# Patient Record
Sex: Male | Born: 1955 | Race: White | Hispanic: No | Marital: Married | Smoking: Never smoker
Health system: Southern US, Community
[De-identification: ages and names within clinical notes are randomized; demographics above are authoritative.]

## PROBLEM LIST (undated history)

## (undated) DIAGNOSIS — Z87442 Personal history of urinary calculi: Secondary | ICD-10-CM

## (undated) DIAGNOSIS — C801 Malignant (primary) neoplasm, unspecified: Secondary | ICD-10-CM

## (undated) DIAGNOSIS — M1990 Unspecified osteoarthritis, unspecified site: Secondary | ICD-10-CM

## (undated) DIAGNOSIS — T7840XA Allergy, unspecified, initial encounter: Secondary | ICD-10-CM

## (undated) DIAGNOSIS — J189 Pneumonia, unspecified organism: Secondary | ICD-10-CM

## (undated) HISTORY — DX: Allergy, unspecified, initial encounter: T78.40XA

## (undated) HISTORY — PX: POLYPECTOMY: SHX149

## (undated) HISTORY — PX: OTHER SURGICAL HISTORY: SHX169

## (undated) HISTORY — PX: COLONOSCOPY: SHX174

---

## 2008-07-20 ENCOUNTER — Ambulatory Visit: Payer: Self-pay | Admitting: Gastroenterology

## 2008-07-20 DIAGNOSIS — K625 Hemorrhage of anus and rectum: Secondary | ICD-10-CM

## 2008-07-27 ENCOUNTER — Telehealth: Payer: Self-pay | Admitting: Gastroenterology

## 2008-07-30 ENCOUNTER — Ambulatory Visit: Payer: Self-pay | Admitting: Gastroenterology

## 2008-07-30 ENCOUNTER — Encounter: Payer: Self-pay | Admitting: Gastroenterology

## 2008-08-04 ENCOUNTER — Encounter: Payer: Self-pay | Admitting: Gastroenterology

## 2016-05-21 ENCOUNTER — Encounter: Payer: Self-pay | Admitting: Gastroenterology

## 2017-01-31 ENCOUNTER — Encounter: Payer: Self-pay | Admitting: Gastroenterology

## 2017-03-11 ENCOUNTER — Ambulatory Visit: Payer: Self-pay | Admitting: Gastroenterology

## 2017-03-11 ENCOUNTER — Telehealth: Payer: Self-pay

## 2017-03-11 NOTE — Telephone Encounter (Signed)
Patient cancelled states he is not having any further rectal bleeding, and weightloss was purposeful. He did not want to reschedule at this time and will call back to reschedule.

## 2018-01-06 ENCOUNTER — Ambulatory Visit (INDEPENDENT_AMBULATORY_CARE_PROVIDER_SITE_OTHER): Payer: BLUE CROSS/BLUE SHIELD

## 2018-01-06 ENCOUNTER — Encounter (INDEPENDENT_AMBULATORY_CARE_PROVIDER_SITE_OTHER): Payer: Self-pay | Admitting: Orthopedic Surgery

## 2018-01-06 ENCOUNTER — Ambulatory Visit (INDEPENDENT_AMBULATORY_CARE_PROVIDER_SITE_OTHER): Payer: BLUE CROSS/BLUE SHIELD | Admitting: Orthopedic Surgery

## 2018-01-06 DIAGNOSIS — G8929 Other chronic pain: Secondary | ICD-10-CM | POA: Diagnosis not present

## 2018-01-06 DIAGNOSIS — M25511 Pain in right shoulder: Secondary | ICD-10-CM | POA: Diagnosis not present

## 2018-01-08 ENCOUNTER — Other Ambulatory Visit (INDEPENDENT_AMBULATORY_CARE_PROVIDER_SITE_OTHER): Payer: Self-pay | Admitting: Orthopedic Surgery

## 2018-01-08 DIAGNOSIS — T1590XA Foreign body on external eye, part unspecified, unspecified eye, initial encounter: Secondary | ICD-10-CM

## 2018-01-10 ENCOUNTER — Encounter (INDEPENDENT_AMBULATORY_CARE_PROVIDER_SITE_OTHER): Payer: Self-pay | Admitting: Orthopedic Surgery

## 2018-01-10 NOTE — Progress Notes (Signed)
Office Visit Note   Patient: Malik Ballard           Date of Birth: 08-Feb-1956           MRN: [id number] Visit Date: 01/06/2018 Requested by: No referring provider defined for this encounter. PCP: Patient, No Pcp Per  Subjective: Chief Complaint  Patient presents with  . Right Shoulder - Pain    HPI: Malik Ballard is a patient with chronic history of right shoulder pain.  It is gotten worse over the years.  Last several months the pain is increased significantly.  He had an injection 20 years ago which helped.  He describes constant pain in the right shoulder deltoid region.  He is taking some over-the-counter medications for his symptoms but it is not helping.  He describes the pain as a burning type pain.  He does not report any weakness or mechanical symptoms.  The pain will wake him from sleep.  He denies any neck pain.  He states that the pain is worse at night.  He works at [employer].  He puts his hand over his head it helps but he localizes the pain discretely to the anterior portion of his shoulder.  He denies any numbness and tingling and denies any neck pain.              ROS: All systems reviewed are negative as they relate to the chief complaint within the history of present illness.  Patient denies  fevers or chills.   Assessment & Plan: Visit Diagnoses:  1. Chronic right shoulder pain     Plan: Impression is right shoulder pain with possible biceps tendon subluxation and supraspinatus rotator cuff tearing either partial or complete.  This is suggested by ultrasound examination today.  Plan MRI arthrogram of that right shoulder to evaluate those 2 structures in the anterior aspect of the shoulder.  I will see him back after that study.  Follow-Up Instructions: Return for after MRI.   Orders:  Orders Placed This Encounter  Procedures  . XR Shoulder Right  . DL FLUORO GUIDED NEEDLE PLC ASPIRATION / INJECTTION/LOC  . MR SHOULDER RIGHT W CONTRAST   No orders of the defined types  were placed in this encounter.     Procedures: No procedures performed   Clinical Data: No additional findings.  Objective: Vital Signs: There were no vitals taken for this visit.  Physical Exam:   Constitutional: Patient appears well-developed HEENT:  Head: Normocephalic Eyes:EOM are normal Neck: Normal range of motion Cardiovascular: Normal rate Pulmonary/chest: Effort normal Neurologic: Patient is alert Skin: Skin is warm Psychiatric: Patient has normal mood and affect    Ortho Exam: Orthopedic exam demonstrates full active and passive range of motion of the right shoulder with good rotator cuff strength to infraspinatus supraspinatus and subscap muscle testing.  O'Brien's testing is negative.  Do not detect any labral pathology with load-and-shift testing.  Negative apprehension testing anteriorly and posteriorly on the right.  Rotator cuff strength intact to infraspinatus supraspinatus and subscap muscle testing of his not much in the way of coarseness with passive range of motion of that right shoulder.  No other masses lymph adenopathy or skin changes noted in the shoulder girdle region.  Specialty Comments:  No specialty comments available.  Imaging: No results found.   PMFS History: Patient Active Problem List   Diagnosis Date Noted  . RECTAL BLEEDING 07/20/2008   History reviewed. No pertinent past medical history.  History reviewed. No pertinent  family history.  History reviewed. No pertinent surgical history. Social History   Occupational History  . Not on file  Tobacco Use  . Smoking status: Not on file  Substance and Sexual Activity  . Alcohol use: Not on file  . Drug use: Not on file  . Sexual activity: Not on file

## 2018-01-22 ENCOUNTER — Ambulatory Visit
Admission: RE | Admit: 2018-01-22 | Discharge: 2018-01-22 | Disposition: A | Discharge status: Home / Self Care | Payer: BLUE CROSS/BLUE SHIELD | Source: Ambulatory Visit | Attending: Orthopedic Surgery | Admitting: Orthopedic Surgery

## 2018-01-22 ENCOUNTER — Ambulatory Visit
Admission: RE | Admit: 2018-01-22 | Discharge: 2018-01-22 | Disposition: A | Discharge status: Home / Self Care | Payer: Self-pay | Source: Ambulatory Visit | Attending: Orthopedic Surgery | Admitting: Orthopedic Surgery

## 2018-01-22 DIAGNOSIS — M25511 Pain in right shoulder: Principal | ICD-10-CM

## 2018-01-22 DIAGNOSIS — T1590XA Foreign body on external eye, part unspecified, unspecified eye, initial encounter: Secondary | ICD-10-CM

## 2018-01-22 DIAGNOSIS — G8929 Other chronic pain: Secondary | ICD-10-CM

## 2018-01-22 MED ORDER — IOPAMIDOL (ISOVUE-M 200) INJECTION 41%
12.0000 mL | Freq: Once | INTRAMUSCULAR | Status: AC
  Administered 2018-01-22: 12 mL via INTRA_ARTICULAR

## 2018-01-22 MED ORDER — IOPAMIDOL (ISOVUE-M 200) INJECTION 41%: 1.0000 mL | Freq: Once | INTRAMUSCULAR | Status: DC

## 2018-01-23 ENCOUNTER — Ambulatory Visit (INDEPENDENT_AMBULATORY_CARE_PROVIDER_SITE_OTHER): Payer: BLUE CROSS/BLUE SHIELD | Admitting: Orthopedic Surgery

## 2018-01-23 ENCOUNTER — Encounter (INDEPENDENT_AMBULATORY_CARE_PROVIDER_SITE_OTHER): Payer: Self-pay | Admitting: Orthopedic Surgery

## 2018-01-23 DIAGNOSIS — M75121 Complete rotator cuff tear or rupture of right shoulder, not specified as traumatic: Secondary | ICD-10-CM

## 2018-01-24 ENCOUNTER — Encounter (INDEPENDENT_AMBULATORY_CARE_PROVIDER_SITE_OTHER): Payer: Self-pay | Admitting: Orthopedic Surgery

## 2018-01-24 NOTE — Progress Notes (Signed)
   Office Visit Note   Patient: Malik Ballard           Date of Birth: Jun 18, 1956           MRN: [id number] Visit Date: 01/23/2018 Requested by: No referring provider defined for this encounter. PCP: Patient, No Pcp Per  Subjective: Chief Complaint  Patient presents with  . Right Shoulder - Follow-up    HPI: Malik Ballard is a patient with shoulder pain.  Since I have seen him he has had an MRI scan.  Injured the shoulder about 4 months ago.  He works as a Librarian, academic.  MRI scan is reviewed with the patient and it does show a mildly retracted rotator cuff tear along with inferior clavicular spur and possible biceps tendinitis.  Jorde of the tear is the supraspinatus but there is also a cleft in the infraspinatus which looks to be potentially repairable with side to side suture.              ROS: All systems reviewed are negative as they relate to the chief complaint within the history of present illness.  Patient denies  fevers or chills.   Assessment & Plan: Visit Diagnoses:  1. Complete tear of right rotator cuff     Plan: Impression is right shoulder rotator cuff tear with inferior clavicular spurring.  Plan is rotator cuff repair with possible biceps tenodesis and subacromial decompression with co-planing of the clavicle to get rid of that spur.  Risk and benefits are discussed with the patient including but not limited to infection nerve vessel damage shoulder stiffness as well as prolonged rehabilitative process required for recovery.  All questions answered  Follow-Up Instructions: No Follow-up on file.   Orders:  No orders of the defined types were placed in this encounter.  No orders of the defined types were placed in this encounter.     Procedures: No procedures performed   Clinical Data: No additional findings.  Objective: Vital Signs: There were no vitals taken for this visit.  Physical Exam:   Constitutional: Patient appears well-developed HEENT:  Head:  Normocephalic Eyes:EOM are normal Neck: Normal range of motion Cardiovascular: Normal rate Pulmonary/chest: Effort normal Neurologic: Patient is alert Skin: Skin is warm Psychiatric: Patient has normal mood and affect    Ortho Exam: Orthopedic exam demonstrates reasonable range of motion with forward flexion abduction and external rotation in both shoulders.  He has pretty good strength to supraspinatus infraspinatus and subscap muscle testing but there is a little bit of coarseness and grinding with labral load testing along with passive range of motion of the head no discrete AC joint tenderness right versus left.  O'Brien's testing equivocal on the right negative on the left.  Impingement signs are positive.  Specialty Comments:  No specialty comments available.  Imaging: No results found.   PMFS History: Patient Active Problem List   Diagnosis Date Noted  . RECTAL BLEEDING 07/20/2008   History reviewed. No pertinent past medical history.  History reviewed. No pertinent family history.  History reviewed. No pertinent surgical history. Social History   Occupational History  . Not on file  Tobacco Use  . Smoking status: Unknown If Ever Smoked  . Smokeless tobacco: Never Used  Substance and Sexual Activity  . Alcohol use: Not on file  . Drug use: Not on file  . Sexual activity: Not on file

## 2018-01-29 ENCOUNTER — Other Ambulatory Visit (INDEPENDENT_AMBULATORY_CARE_PROVIDER_SITE_OTHER): Payer: Self-pay | Admitting: Orthopedic Surgery

## 2018-01-29 DIAGNOSIS — M75101 Unspecified rotator cuff tear or rupture of right shoulder, not specified as traumatic: Secondary | ICD-10-CM

## 2018-01-30 NOTE — Progress Notes (Addendum)
IRJ:JOACZY, Curt Jews, MD   Cardiologist: pt denies  EKG:pt denies past year  Stress test:pt denies ever  ECHO: pt denies ever  Cardiac Cath: pt denies ever  Chest x-ray: pt has been on antibiotics for bronchitis, will obtain chest x-ray today

## 2018-01-30 NOTE — Pre-Procedure Instructions (Signed)
    Malik Ballard  01/30/2018      [id number] - [place], [place] - [address] [facility] [place] [address] Phone: [phone] Fax: [phone]    Your procedure is scheduled on February 04, 2018.  Report to [facility] Admitting at 1:10 PM.  Call this number if you have problems the morning of surgery:  [phone]   Remember:  Do not eat food or drink liquids after midnight.  Take these medicines the morning of surgery with A SIP OF WATER azithromycin (if you are still taking this).   Do not wear jewelry.  Do not wear lotions, powders, or colognes, or deodorant.  Men may shave face and neck.  Do not bring valuables to the hospital.  [facility] is not responsible for any belongings or valuables.  Contacts, dentures or bridgework may not be worn into surgery.  Leave your suitcase in the car.  After surgery it may be brought to your room.  For patients admitted to the hospital, discharge time will be determined by your treatment team.  Patients discharged the day of surgery will not be allowed to drive home.   Special instructions:  [place]- Preparing For Surgery  Before surgery, you can play an important role. Because skin is not sterile, your skin needs to be as free of germs as possible. You can reduce the number of germs on your skin by washing with CHG (chlorahexidine gluconate) Soap before surgery.  CHG is an antiseptic cleaner which kills germs and bonds with the skin to continue killing germs even after washing.  Please do not use if you have an allergy to CHG or antibacterial soaps. If your skin becomes reddened/irritated stop using the CHG.  Do not shave (including legs and underarms) for at least 48 hours prior to first CHG shower. It is OK to shave your face.  Please follow these instructions carefully.   1. Shower the NIGHT BEFORE SURGERY and the MORNING OF SURGERY with CHG.   2. If you chose to wash your hair, wash your hair  first as usual with your normal shampoo.  3. After you shampoo, rinse your hair and body thoroughly to remove the shampoo.  4. Use CHG as you would any other liquid soap. You can apply CHG directly to the skin and wash gently with a scrungie or a clean washcloth.   5. Apply the CHG Soap to your body ONLY FROM THE NECK DOWN.  Do not use on open wounds or open sores. Avoid contact with your eyes, ears, mouth and genitals (private parts). Wash Face and genitals (private parts)  with your normal soap.  6. Wash thoroughly, paying special attention to the area where your surgery will be performed.  7. Thoroughly rinse your body with warm water from the neck down.  8. DO NOT shower/wash with your normal soap after using and rinsing off the CHG Soap.  9. Pat yourself dry with a CLEAN TOWEL.  10. Wear CLEAN PAJAMAS to bed the night before surgery, wear comfortable clothes the morning of surgery  11. Place CLEAN SHEETS on your bed the night of your first shower and DO NOT SLEEP WITH PETS.  Day of Surgery: Do not apply any deodorants/lotions. Please wear clean clothes to the hospital/surgery center.    Please read over the following fact sheets that you were given. Pain Booklet, Coughing and Deep Breathing and Surgical Site Infection Prevention

## 2018-01-31 ENCOUNTER — Ambulatory Visit (HOSPITAL_COMMUNITY)
Admission: RE | Admit: 2018-01-31 | Discharge: 2018-01-31 | Disposition: A | Discharge status: Home / Self Care | Payer: BLUE CROSS/BLUE SHIELD | Source: Ambulatory Visit | Attending: Anesthesiology | Admitting: Anesthesiology

## 2018-01-31 ENCOUNTER — Encounter (HOSPITAL_COMMUNITY)
Admission: RE | Admit: 2018-01-31 | Discharge: 2018-01-31 | Disposition: A | Discharge status: Home / Self Care | Payer: BLUE CROSS/BLUE SHIELD | Source: Ambulatory Visit | Attending: Orthopedic Surgery | Admitting: Orthopedic Surgery

## 2018-01-31 ENCOUNTER — Encounter (HOSPITAL_COMMUNITY): Payer: Self-pay

## 2018-01-31 ENCOUNTER — Other Ambulatory Visit: Payer: Self-pay

## 2018-01-31 DIAGNOSIS — M7521 Bicipital tendinitis, right shoulder: Secondary | ICD-10-CM | POA: Diagnosis not present

## 2018-01-31 DIAGNOSIS — M719 Bursopathy, unspecified: Secondary | ICD-10-CM | POA: Insufficient documentation

## 2018-01-31 DIAGNOSIS — Z01818 Encounter for other preprocedural examination: Secondary | ICD-10-CM | POA: Insufficient documentation

## 2018-01-31 DIAGNOSIS — R918 Other nonspecific abnormal finding of lung field: Secondary | ICD-10-CM | POA: Insufficient documentation

## 2018-01-31 DIAGNOSIS — Z01812 Encounter for preprocedural laboratory examination: Secondary | ICD-10-CM | POA: Diagnosis not present

## 2018-01-31 DIAGNOSIS — M75101 Unspecified rotator cuff tear or rupture of right shoulder, not specified as traumatic: Secondary | ICD-10-CM | POA: Diagnosis not present

## 2018-01-31 HISTORY — DX: Unspecified osteoarthritis, unspecified site: M19.90

## 2018-01-31 HISTORY — DX: Malignant (primary) neoplasm, unspecified: C80.1

## 2018-01-31 LAB — CBC
HCT: 45.9 % (ref 39.0–52.0)
HEMOGLOBIN: 15.5 g/dL (ref 13.0–17.0)
MCH: 29.4 pg (ref 26.0–34.0)
MCHC: 33.8 g/dL (ref 30.0–36.0)
MCV: 87.1 fL (ref 78.0–100.0)
Platelets: 134 10*3/uL — ABNORMAL LOW (ref 150–400)
RBC: 5.27 MIL/uL (ref 4.22–5.81)
RDW: 13.2 % (ref 11.5–15.5)
WBC: 4.9 10*3/uL (ref 4.0–10.5)

## 2018-02-03 MED ORDER — DEXTROSE 5 % IV SOLN
3.0000 g | INTRAVENOUS | Status: AC
  Administered 2018-02-04: 3 g via INTRAVENOUS
  Filled 2018-02-03: qty 3

## 2018-02-03 NOTE — H&P (Signed)
Malik Ballard is an 62 y.o. male.   Chief Complaint: Right shoulder pain HPI: Malik Ballard is a 62 year old patient with right shoulder pain.  He has had this for several months.  MRI scan shows biceps tendinopathy, inferior clavicular spur, as well as rotator cuff tear of the supraspinatus.  He has failed conservative management.  He reports weakness and pain refractory to conservative measures.  He desires surgical correction.  Past Medical History:  Diagnosis Date  . Arthritis   . Cancer ([place])    skin cancer removed basal cell    Past Surgical History:  Procedure Laterality Date  . removed bone spur from right foot      No family history on file. Social History:  reports that  has never smoked. he has never used smokeless tobacco. He reports that he does not drink alcohol or use drugs.  Allergies: No Known Allergies  No medications prior to admission.    No results found for this or any previous visit (from the past 48 hour(s)). No results found.  Review of Systems  Musculoskeletal: Positive for joint pain.  All other systems reviewed and are negative.   There were no vitals taken for this visit. Physical Exam  Constitutional: He appears well-developed.  HENT:  Head: Normocephalic.  Eyes: Pupils are equal, round, and reactive to light.  Neck: Normal range of motion.  Cardiovascular: Normal rate.  Respiratory: Effort normal.  Neurological: He is alert.  Skin: Skin is warm.  Psychiatric: He has a normal mood and affect.  The right shoulder demonstrates no asymmetric AC joint tenderness.  There is some coarse grinding with passive range of motion O'Brien's testing positive on the right.  Cervical spine range of motion full patient does have weakness to supraspinatus testing on the right compared to the left.  Infraspinatus and subscap testing intact bilaterally.  Assessment/Plan Impression is right shoulder rotator cuff tear and biceps tendinopathy with inferior clavicular spur.   Plan is arthroscopy with subacromial decompression biceps tenotomy and tenodesis with debridement.  Mini open rotator cuff repair and subacromial spur and inferior clavicular spur removal.  Risk and benefits are discussed including but not limited to infection nerve vessel damage shoulder stiffness and incomplete pain relief as well as prolonged rehabilitative time.  Plan to use CPM machine postoperatively.  All questions answered.Anderson Malta, MD 02/03/2018, 11:19 PM

## 2018-02-04 ENCOUNTER — Encounter (HOSPITAL_COMMUNITY)
Admission: RE | Disposition: A | Discharge status: Home / Self Care | Payer: Self-pay | Source: Ambulatory Visit | Attending: Orthopedic Surgery

## 2018-02-04 ENCOUNTER — Encounter (HOSPITAL_COMMUNITY): Payer: Self-pay | Admitting: Urology

## 2018-02-04 ENCOUNTER — Encounter (INDEPENDENT_AMBULATORY_CARE_PROVIDER_SITE_OTHER): Payer: Self-pay | Admitting: Orthopedic Surgery

## 2018-02-04 ENCOUNTER — Ambulatory Visit (HOSPITAL_COMMUNITY): Payer: BLUE CROSS/BLUE SHIELD | Admitting: Certified Registered Nurse Anesthetist

## 2018-02-04 ENCOUNTER — Ambulatory Visit (HOSPITAL_COMMUNITY)
Admission: RE | Admit: 2018-02-04 | Discharge: 2018-02-04 | Disposition: A | Discharge status: Home / Self Care | Payer: BLUE CROSS/BLUE SHIELD | Source: Ambulatory Visit | Attending: Orthopedic Surgery | Admitting: Orthopedic Surgery

## 2018-02-04 DIAGNOSIS — M75121 Complete rotator cuff tear or rupture of right shoulder, not specified as traumatic: Secondary | ICD-10-CM | POA: Diagnosis not present

## 2018-02-04 DIAGNOSIS — M75101 Unspecified rotator cuff tear or rupture of right shoulder, not specified as traumatic: Secondary | ICD-10-CM | POA: Insufficient documentation

## 2018-02-04 DIAGNOSIS — M7521 Bicipital tendinitis, right shoulder: Secondary | ICD-10-CM | POA: Diagnosis not present

## 2018-02-04 DIAGNOSIS — Z85828 Personal history of other malignant neoplasm of skin: Secondary | ICD-10-CM | POA: Diagnosis not present

## 2018-02-04 DIAGNOSIS — M1990 Unspecified osteoarthritis, unspecified site: Secondary | ICD-10-CM | POA: Diagnosis not present

## 2018-02-04 HISTORY — PX: SHOULDER ARTHROSCOPY WITH ROTATOR CUFF REPAIR AND SUBACROMIAL DECOMPRESSION: SHX5686

## 2018-02-04 SURGERY — SHOULDER ARTHROSCOPY WITH ROTATOR CUFF REPAIR AND SUBACROMIAL DECOMPRESSION
Anesthesia: General | Site: Shoulder | Laterality: Right

## 2018-02-04 MED ORDER — FENTANYL CITRATE (PF) 100 MCG/2ML IJ SOLN: 25.0000 ug | INTRAMUSCULAR | Status: DC | PRN

## 2018-02-04 MED ORDER — CHLORHEXIDINE GLUCONATE 4 % EX LIQD: 60.0000 mL | Freq: Once | CUTANEOUS | Status: DC

## 2018-02-04 MED ORDER — EPINEPHRINE PF 1 MG/10ML IJ SOSY
PREFILLED_SYRINGE | INTRAMUSCULAR | Status: AC
  Filled 2018-02-04: qty 10

## 2018-02-04 MED ORDER — FENTANYL CITRATE (PF) 100 MCG/2ML IJ SOLN
INTRAMUSCULAR | Status: AC
  Administered 2018-02-04: 100 ug via INTRAVENOUS
  Filled 2018-02-04: qty 2

## 2018-02-04 MED ORDER — SODIUM CHLORIDE 0.9 % IR SOLN
Status: DC | PRN
  Administered 2018-02-04: 6000 mL

## 2018-02-04 MED ORDER — DEXAMETHASONE SODIUM PHOSPHATE 10 MG/ML IJ SOLN
INTRAMUSCULAR | Status: AC
  Filled 2018-02-04: qty 1

## 2018-02-04 MED ORDER — LACTATED RINGERS IV SOLN: INTRAVENOUS | Status: DC

## 2018-02-04 MED ORDER — FENTANYL CITRATE (PF) 250 MCG/5ML IJ SOLN
INTRAMUSCULAR | Status: AC
  Filled 2018-02-04: qty 5

## 2018-02-04 MED ORDER — EPINEPHRINE PF 1 MG/ML IJ SOLN
INTRAMUSCULAR | Status: AC
  Filled 2018-02-04: qty 1

## 2018-02-04 MED ORDER — LIDOCAINE 2% (20 MG/ML) 5 ML SYRINGE
INTRAMUSCULAR | Status: AC
  Filled 2018-02-04: qty 5

## 2018-02-04 MED ORDER — PHENYLEPHRINE HCL 10 MG/ML IJ SOLN
INTRAVENOUS | Status: DC | PRN
  Administered 2018-02-04: 15 ug/min via INTRAVENOUS

## 2018-02-04 MED ORDER — MIDAZOLAM HCL 2 MG/2ML IJ SOLN
INTRAMUSCULAR | Status: AC
  Filled 2018-02-04: qty 2

## 2018-02-04 MED ORDER — MIDAZOLAM HCL 2 MG/2ML IJ SOLN
INTRAMUSCULAR | Status: AC
  Administered 2018-02-04: 2 mg via INTRAVENOUS
  Filled 2018-02-04: qty 2

## 2018-02-04 MED ORDER — FENTANYL CITRATE (PF) 100 MCG/2ML IJ SOLN
INTRAMUSCULAR | Status: DC | PRN
  Administered 2018-02-04: 100 ug via INTRAVENOUS

## 2018-02-04 MED ORDER — ROCURONIUM BROMIDE 10 MG/ML (PF) SYRINGE
PREFILLED_SYRINGE | INTRAVENOUS | Status: AC
  Filled 2018-02-04: qty 10

## 2018-02-04 MED ORDER — MIDAZOLAM HCL 2 MG/2ML IJ SOLN
2.0000 mg | Freq: Once | INTRAMUSCULAR | Status: AC
  Administered 2018-02-04: 2 mg via INTRAVENOUS

## 2018-02-04 MED ORDER — MEPERIDINE HCL 25 MG/ML IJ SOLN: 6.2500 mg | INTRAMUSCULAR | Status: DC | PRN

## 2018-02-04 MED ORDER — PROPOFOL 10 MG/ML IV BOLUS
INTRAVENOUS | Status: AC
  Filled 2018-02-04: qty 20

## 2018-02-04 MED ORDER — ROPIVACAINE HCL 5 MG/ML IJ SOLN
INTRAMUSCULAR | Status: DC | PRN
  Administered 2018-02-04: 30 mL via PERINEURAL

## 2018-02-04 MED ORDER — FENTANYL CITRATE (PF) 100 MCG/2ML IJ SOLN
100.0000 ug | Freq: Once | INTRAMUSCULAR | Status: AC
  Administered 2018-02-04: 100 ug via INTRAVENOUS

## 2018-02-04 MED ORDER — PROPOFOL 10 MG/ML IV BOLUS
INTRAVENOUS | Status: DC | PRN
  Administered 2018-02-04: 300 mg via INTRAVENOUS

## 2018-02-04 MED ORDER — ROCURONIUM BROMIDE 100 MG/10ML IV SOLN
INTRAVENOUS | Status: DC | PRN
  Administered 2018-02-04 (×2): 10 mg via INTRAVENOUS
  Administered 2018-02-04: 50 mg via INTRAVENOUS

## 2018-02-04 MED ORDER — ROCURONIUM BROMIDE 10 MG/ML (PF) SYRINGE
PREFILLED_SYRINGE | INTRAVENOUS | Status: AC
  Filled 2018-02-04: qty 5

## 2018-02-04 MED ORDER — METOCLOPRAMIDE HCL 5 MG/ML IJ SOLN: 10.0000 mg | Freq: Once | INTRAMUSCULAR | Status: DC | PRN

## 2018-02-04 MED ORDER — EPINEPHRINE PF 1 MG/ML IJ SOLN
INTRAMUSCULAR | Status: DC | PRN
  Administered 2018-02-04: .1 mL via SUBCUTANEOUS

## 2018-02-04 MED ORDER — LIDOCAINE HCL (CARDIAC) 20 MG/ML IV SOLN
INTRAVENOUS | Status: DC | PRN
  Administered 2018-02-04: 60 mg via INTRAVENOUS

## 2018-02-04 MED ORDER — LACTATED RINGERS IV SOLN
INTRAVENOUS | Status: DC
  Administered 2018-02-04 (×3): via INTRAVENOUS

## 2018-02-04 MED ORDER — ONDANSETRON HCL 4 MG/2ML IJ SOLN
INTRAMUSCULAR | Status: AC
  Filled 2018-02-04: qty 2

## 2018-02-04 MED ORDER — DEXAMETHASONE SODIUM PHOSPHATE 10 MG/ML IJ SOLN
INTRAMUSCULAR | Status: DC | PRN
  Administered 2018-02-04: 10 mg via INTRAVENOUS

## 2018-02-04 MED ORDER — SUGAMMADEX SODIUM 500 MG/5ML IV SOLN
INTRAVENOUS | Status: DC | PRN
  Administered 2018-02-04: 160 mg via INTRAVENOUS

## 2018-02-04 SURGICAL SUPPLY — 74 items
ALCOHOL 70% 16 OZ (MISCELLANEOUS) ×3 IMPLANT
ANCHOR SUT BIOCOMP CORKSREW (Anchor) ×6 IMPLANT
ANCHOR SUT SWIVELLOK BIO (Anchor) ×3 IMPLANT
BLADE CUTTER GATOR 3.5 (BLADE) IMPLANT
BLADE GREAT WHITE 4.2 (BLADE) IMPLANT
BLADE GREAT WHITE 4.2MM (BLADE)
BLADE SURG 11 STRL SS (BLADE) IMPLANT
BUR OVAL 6.0 (BURR) IMPLANT
CLOSURE STERI-STRIP 1/4X4 (GAUZE/BANDAGES/DRESSINGS) ×3 IMPLANT
CLOSURE WOUND 1/2 X4 (GAUZE/BANDAGES/DRESSINGS) ×1
COVER SURGICAL LIGHT HANDLE (MISCELLANEOUS) ×3 IMPLANT
DRAPE INCISE IOBAN 66X45 STRL (DRAPES) ×6 IMPLANT
DRAPE STERI 35X30 U-POUCH (DRAPES) ×3 IMPLANT
DRAPE U-SHAPE 47X51 STRL (DRAPES) ×6 IMPLANT
DRSG AQUACEL AG ADV 3.5X 6 (GAUZE/BANDAGES/DRESSINGS) ×3 IMPLANT
DRSG TEGADERM 4X4.75 (GAUZE/BANDAGES/DRESSINGS) ×3 IMPLANT
DURAPREP 26ML APPLICATOR (WOUND CARE) ×3 IMPLANT
ELECT REM PT RETURN 9FT ADLT (ELECTROSURGICAL) ×3
ELECTRODE REM PT RTRN 9FT ADLT (ELECTROSURGICAL) ×1 IMPLANT
FILTER STRAW FLUID ASPIR (MISCELLANEOUS) ×3 IMPLANT
GAUZE SPONGE 4X4 12PLY STRL (GAUZE/BANDAGES/DRESSINGS) ×3 IMPLANT
GAUZE XEROFORM 1X8 LF (GAUZE/BANDAGES/DRESSINGS) ×3 IMPLANT
GLOVE BIOGEL PI IND STRL 7.5 (GLOVE) ×1 IMPLANT
GLOVE BIOGEL PI IND STRL 8 (GLOVE) ×1 IMPLANT
GLOVE BIOGEL PI INDICATOR 7.5 (GLOVE) ×2
GLOVE BIOGEL PI INDICATOR 8 (GLOVE) ×2
GLOVE ECLIPSE 7.0 STRL STRAW (GLOVE) ×3 IMPLANT
GLOVE SURG ORTHO 8.0 STRL STRW (GLOVE) ×3 IMPLANT
GOWN STRL REUS W/ TWL LRG LVL3 (GOWN DISPOSABLE) ×3 IMPLANT
GOWN STRL REUS W/TWL LRG LVL3 (GOWN DISPOSABLE) ×6
HYDROGEN PEROXIDE 16OZ (MISCELLANEOUS) ×3 IMPLANT
KIT BASIN OR (CUSTOM PROCEDURE TRAY) ×3 IMPLANT
KIT ROOM TURNOVER OR (KITS) ×3 IMPLANT
MANIFOLD NEPTUNE II (INSTRUMENTS) ×3 IMPLANT
NDL SUT 6 .5 CRC .975X.05 MAYO (NEEDLE) ×1 IMPLANT
NEEDLE HYPO 25X1 1.5 SAFETY (NEEDLE) ×3 IMPLANT
NEEDLE MAYO TAPER (NEEDLE) ×2
NEEDLE SCORPION MULTI FIRE (NEEDLE) ×3 IMPLANT
NEEDLE SPNL 18GX3.5 QUINCKE PK (NEEDLE) ×3 IMPLANT
NS IRRIG 1000ML POUR BTL (IV SOLUTION) ×3 IMPLANT
PACK SHOULDER (CUSTOM PROCEDURE TRAY) ×3 IMPLANT
PAD ARMBOARD 7.5X6 YLW CONV (MISCELLANEOUS) ×6 IMPLANT
PUSHLOCK PEEK 4.5X24 (Orthopedic Implant) ×6 IMPLANT
RESTRAINT HEAD UNIVERSAL NS (MISCELLANEOUS) ×3 IMPLANT
SET ARTHROSCOPY TUBING (MISCELLANEOUS) ×2
SET ARTHROSCOPY TUBING LN (MISCELLANEOUS) ×1 IMPLANT
SLING ARM IMMOBILIZER LRG (SOFTGOODS) ×3 IMPLANT
SOL PREP POV-IOD 4OZ 10% (MISCELLANEOUS) ×3 IMPLANT
SOLUTION BETADINE 4OZ (MISCELLANEOUS) ×3 IMPLANT
SPONGE LAP 4X18 X RAY DECT (DISPOSABLE) ×3 IMPLANT
STRIP CLOSURE SKIN 1/2X4 (GAUZE/BANDAGES/DRESSINGS) ×2 IMPLANT
SUCTION FRAZIER HANDLE 10FR (MISCELLANEOUS) ×2
SUCTION TUBE FRAZIER 10FR DISP (MISCELLANEOUS) ×1 IMPLANT
SUT 2 FIBERLOOP 20 STRT BLUE (SUTURE) ×3
SUT ETHILON 3 0 PS 1 (SUTURE) ×6 IMPLANT
SUT FIBERWIRE #2 38 T-5 BLUE (SUTURE)
SUT MNCRL AB 3-0 PS2 18 (SUTURE) ×3 IMPLANT
SUT VIC AB 0 CT1 27 (SUTURE) ×4
SUT VIC AB 0 CT1 27XBRD ANBCTR (SUTURE) ×2 IMPLANT
SUT VIC AB 1 CT1 27 (SUTURE) ×2
SUT VIC AB 1 CT1 27XBRD ANBCTR (SUTURE) ×1 IMPLANT
SUT VIC AB 2-0 CT1 27 (SUTURE) ×2
SUT VIC AB 2-0 CT1 TAPERPNT 27 (SUTURE) ×1 IMPLANT
SUT VICRYL 0 UR6 27IN ABS (SUTURE) ×3 IMPLANT
SUTURE 2 FIBERLOOP 20 STRT BLU (SUTURE) ×1 IMPLANT
SUTURE FIBERWR #2 38 T-5 BLUE (SUTURE) IMPLANT
SYR 20CC LL (SYRINGE) ×6 IMPLANT
SYR 30ML LL (SYRINGE) ×3 IMPLANT
SYR TB 1ML LUER SLIP (SYRINGE) ×3 IMPLANT
TOWEL OR 17X24 6PK STRL BLUE (TOWEL DISPOSABLE) ×3 IMPLANT
TOWEL OR 17X26 10 PK STRL BLUE (TOWEL DISPOSABLE) ×3 IMPLANT
WAND HAND CNTRL MULTIVAC 90 (MISCELLANEOUS) IMPLANT
WAND STAR VAC 90 (SURGICAL WAND) ×3 IMPLANT
WATER STERILE IRR 1000ML POUR (IV SOLUTION) ×3 IMPLANT

## 2018-02-04 NOTE — Anesthesia Procedure Notes (Signed)
Procedures

## 2018-02-04 NOTE — Anesthesia Postprocedure Evaluation (Signed)
Anesthesia Post Note  Patient: Malik Ballard  Procedure(s) Performed: RIGHT SHOULDER ARTHROSCOPY WITH MINI-OPEN ROTATOR CUFF REPAIR, BICEPS TENODESIS, SUBACROMIAL DECOMPRESSION, CLAVICULAR SPUR REMOVAL (Right Shoulder)     Patient location during evaluation: PACU Anesthesia Type: General Level of consciousness: awake and alert Pain management: pain level controlled Vital Signs Assessment: post-procedure vital signs reviewed and stable Respiratory status: spontaneous breathing, nonlabored ventilation, respiratory function stable and patient connected to nasal cannula oxygen Cardiovascular status: blood pressure returned to baseline and stable Postop Assessment: no apparent nausea or vomiting Anesthetic complications: no    Last Vitals:  Vitals:   02/04/18 1822 02/04/18 1830  BP: 137/86   Pulse: 74 77  Resp: 16 15  Temp:  36.4 C  SpO2: 96% 97%    Last Pain:  Vitals:   02/04/18 1830  TempSrc:   PainSc: 0-No pain                 Naira Standiford COKER

## 2018-02-04 NOTE — Transfer of Care (Signed)
Immediate Anesthesia Transfer of Care Note  Patient: Malik Ballard  Procedure(s) Performed: RIGHT SHOULDER ARTHROSCOPY WITH MINI-OPEN ROTATOR CUFF REPAIR, BICEPS TENODESIS, SUBACROMIAL DECOMPRESSION, CLAVICULAR SPUR REMOVAL (Right Shoulder)  Patient Location: PACU  Anesthesia Type:GA combined with regional for post-op pain  Level of Consciousness: awake and patient cooperative  Airway & Oxygen Therapy: Patient Spontanous Breathing  Post-op Assessment: Report given to RN and Post -op Vital signs reviewed and stable  Post vital signs: Reviewed and stable  Last Vitals:  Vitals:   02/04/18 1400 02/04/18 1737  BP: (!) 147/92   Pulse: 70   Resp: (!) 7   Temp:  (P) 36.6 C  SpO2: 95%     Last Pain:  Vitals:   02/04/18 1737  TempSrc:   PainSc: (P) 0-No pain         Complications: No apparent anesthesia complications

## 2018-02-04 NOTE — Anesthesia Preprocedure Evaluation (Signed)
Anesthesia Evaluation  Patient identified by MRN, date of birth, ID band Patient awake    Reviewed: Allergy & Precautions, NPO status , Patient's Chart, lab work & pertinent test results  Airway Mallampati: II  TM Distance: >3 FB Neck ROM: Full    Dental no notable dental hx.    Pulmonary neg pulmonary ROS,    Pulmonary exam normal breath sounds clear to auscultation       Cardiovascular negative cardio ROS Normal cardiovascular exam Rhythm:Regular Rate:Normal     Neuro/Psych negative neurological ROS  negative psych ROS   GI/Hepatic negative GI ROS, Neg liver ROS,   Endo/Other  negative endocrine ROS  Renal/GU negative Renal ROS  negative genitourinary   Musculoskeletal negative musculoskeletal ROS (+)   Abdominal   Peds negative pediatric ROS (+)  Hematology negative hematology ROS (+)   Anesthesia Other Findings   Reproductive/Obstetrics negative OB ROS                             Anesthesia Physical Anesthesia Plan  ASA: II  Anesthesia Plan: General   Post-op Pain Management:  Regional for Post-op pain and GA combined w/ Regional for post-op pain   Induction: Intravenous  PONV Risk Score and Plan: 2 and Ondansetron and Treatment may vary due to age or medical condition  Airway Management Planned: Oral ETT  Additional Equipment:   Intra-op Plan:   Post-operative Plan: Extubation in OR  Informed Consent: I have reviewed the patients History and Physical, chart, labs and discussed the procedure including the risks, benefits and alternatives for the proposed anesthesia with the patient or authorized representative who has indicated his/her understanding and acceptance.   Dental advisory given  Plan Discussed with: CRNA  Anesthesia Plan Comments:         Anesthesia Quick Evaluation

## 2018-02-04 NOTE — Anesthesia Procedure Notes (Signed)
Procedure Name: Intubation Date/Time: 02/04/2018 2:21 PM Performed by: Raenette Rover, CRNA Pre-anesthesia Checklist: Patient identified, Emergency Drugs available, Suction available and Patient being monitored Patient Re-evaluated:Patient Re-evaluated prior to induction Oxygen Delivery Method: Circle system utilized Preoxygenation: Pre-oxygenation with 100% oxygen Induction Type: IV induction Ventilation: Mask ventilation without difficulty, Two handed mask ventilation required and Oral airway inserted - appropriate to patient size Laryngoscope Size: Mac and 4 Grade View: Grade III Tube type: Oral Tube size: 7.5 mm Number of attempts: 1 Airway Equipment and Method: Stylet Placement Confirmation: positive ETCO2,  CO2 detector and breath sounds checked- equal and bilateral Secured at: 23 cm Tube secured with: Tape Dental Injury: Teeth and Oropharynx as per pre-operative assessment

## 2018-02-04 NOTE — Anesthesia Procedure Notes (Signed)
Anesthesia Regional Block: Supraclavicular block   Pre-Anesthetic Checklist: ,, timeout performed, Correct Patient, Correct Site, Correct Laterality, Correct Procedure, Correct Position, site marked, Risks and benefits discussed,  Surgical consent,  Pre-op evaluation,  At surgeon's request and post-op pain management  Laterality: Right and Upper  Prep: Maximum Sterile Barrier Precautions used, chloraprep       Needles:  Injection technique: Single-shot  Needle Type: Echogenic Stimulator Needle     Needle Length: 10cm      Additional Needles:   Procedures:,,,, ultrasound used (permanent image in chart),,,,  Narrative:  Start time: 02/04/2018 1:54 PM End time: 02/04/2018 2:04 PM Injection made incrementally with aspirations every 5 mL.  Performed by: Personally  Anesthesiologist: Montez Hageman, MD  Additional Notes: Risks, benefits and alternative to block explained extensively.  Patient tolerated procedure well, without complications.

## 2018-02-04 NOTE — Interval H&P Note (Signed)
History and Physical Interval Note:  02/04/2018 1:51 PM  Malik Ballard  has presented today for surgery, with the diagnosis of right shoulder rotator cuff tear, bursitis, biceps tendonitis  The various methods of treatment have been discussed with the patient and family. After consideration of risks, benefits and other options for treatment, the patient has consented to  Procedure(s): RIGHT SHOULDER ARTHROSCOPY WITH MINI-OPEN ROTATOR CUFF REPAIR, BICEPS TENODESIS, SUBACROMIAL DECOMPRESSION, CLAVICULAR SPUR REMOVAL (Right) as a surgical intervention .  The patient's history has been reviewed, patient examined, no change in status, stable for surgery.  I have reviewed the patient's chart and labs.  Questions were answered to the patient's satisfaction.     Anderson Malta

## 2018-02-04 NOTE — Brief Op Note (Signed)
02/04/2018  5:56 PM  PATIENT:  Malik Ballard  62 y.o. male  PRE-OPERATIVE DIAGNOSIS:  right shoulder rotator cuff tear, bursitis, biceps tendonitis  POST-OPERATIVE DIAGNOSIS:  right shoulder rotator cuff tear, bursitis, biceps tendonitis  PROCEDURE:  Procedure(s): RIGHT SHOULDER ARTHROSCOPY WITH MINI-OPEN ROTATOR CUFF REPAIR, BICEPS TENODESIS, SUBACROMIAL DECOMPRESSION, CLAVICULAR SPUR REMOVAL  SURGEON:  Surgeon(s): Marlou Sa, Tonna Corner, MD  ASSISTANT: Laure Kidney rnfa  ANESTHESIA:   general  EBL: 50 ml    Total I/O In: 2000 [I.V.:2000] Out: 50 [Blood:50]  BLOOD ADMINISTERED: none  DRAINS: none   LOCAL MEDICATIONS USED:  none  SPECIMEN:  No Specimen  COUNTS:  YES  TOURNIQUET:  * No tourniquets in log *  DICTATION: .Other Dictation: Dictation Number [id number]  PLAN OF CARE: Discharge to home after PACU  PATIENT DISPOSITION:  PACU - hemodynamically stable

## 2018-02-05 ENCOUNTER — Encounter (HOSPITAL_COMMUNITY): Payer: Self-pay | Admitting: Orthopedic Surgery

## 2018-02-05 NOTE — Op Note (Signed)
Malik Ballard, Malik Ballard NO.:  [id number]  MEDICAL RECORD NO.:  [id number]  LOCATION:                                 FACILITY:  PHYSICIAN:  Anderson Malta, M.D.         DATE OF BIRTH:  DATE OF PROCEDURE:  02/04/2018 DATE OF DISCHARGE:  02/04/2018                              OPERATIVE REPORT   PREOPERATIVE DIAGNOSIS:  Right shoulder rotator cuff tear and biceps tendinitis.  POSTOPERATIVE DIAGNOSIS:  Right shoulder rotator cuff tear and biceps tendinitis.  PROCEDURE:  Right shoulder arthroscopy, subacromial decompression, biceps tendon release with superior labral debridement, rotator interval release to help mobilize the rotator cuff along with open biceps tenodesis, and mini open rotator cuff repair.  SURGEON:  Anderson Malta, MD.  ASSISTANT:  Laure Kidney, RNFA.  INDICATIONS:  Wilberto is a 62 year old patient with right shoulder pain, presents for operative management after explanation of risks and benefits.  PROCEDURE IN DETAIL:  The patient was brought to the operating room where general anesthetic was induced.  Preoperative antibiotics were administered.  Time-out was called.  The patient was placed in the beach chair position with the head in neutral position.  Right shoulder was examined under anesthesia and found to have full range of motion without limitation of external rotation of 15 degrees of abduction, also had good shoulder stability, anterior and posterior.  Following examination under anesthesia of the right shoulder, time-out was called.  The patient's right arm was prescrubbed with alcohol and Betadine, allowed to air dry, prepped with DuraPrep solution and draped in a sterile manner.  Charlie Pitter was used to cover the axilla.  Posterior portal was created 2 cm medial and inferior to the posterolateral margin of the acromion.  The patient had a very large shoulder.  Diagnostic arthroscopy was performed.  This demonstrated a significant tear of  the supraspinatus tendon with retraction.  This appeared to be a chronic tear.  Edges were rounded.  Biceps tendon also had significant tendinitis as well as degenerative type superior labral tearing.  Biceps tendon was released after creating anterior portal under direct visualization.  The rotator cuff seemed to be mobilizable.  Dissection was performed on the superior aspect of the glenoid back about less than 8 mm.  Intra-articular subscap was intact and the glenohumeral articular surfaces were intact.  The scope was then placed into the subacromial space and a lateral portal was created.  Bursectomy performed.  Inferior clavicular spur was removed with a shaver.  At this time, instruments were removed.  Portals were closed using 3-0 nylon.  Charlie Pitter was used to cover the entire field.  The anterolateral acromion was identified.  An incision was made off the anterolateral acromion measuring about 5 cm. Deltoid was split and measured distance of 4 cm from the anterolateral margin of the acromion.  Stay suture was placed.  The patient had a very large deltoid.  Self-retaining retractor was placed and the rotator cuff tear was identified.  Biceps tendon was tenodesed into the bicipital groove using 6.5 SwiveLock.  This gave good secure fixation and appropriate tension.  Following this, attention was directed toward  the rotator cuff tear.  Essentially, the patient had a supraspinatus tear, but the footprint of the rotator cuff attachment site onto the tuberosity was intact.  There was a split type tear extending between the supraspinatus and infraspinatus.  The stay sutures were placed into the supraspinatus, which was mobilized anterior and lateral.  After that was done, the footprint was curetted.  Two corkscrew anchors were placed at the junction of the articular margin of the footprint.  Once the tendon reattachment site was visualized, the 4 FiberTape were placed through the  supraspinatus tendon, but not tied.  With traction sutures at the end of the supraspinatus pulling anterior and lateral and with the arm externally rotated about 40 degrees, the supraspinatus was reattached anteriorly within the rotator interval as well as posteriorly to the native footprint of the supraspinatus and infraspinatus.  This was done with 0 FiberWire suture.  Following this side-to-side closure, 2 FiberWire sutures were placed in the very tip of the supraspinatus and these were incorporated into the PushLock repair.  The 8 FiberTape suture limbs were tied, crossed, and then placed in the 2 PushLock with watertight repair achieved.  This was done in crossed type fashion.  At this time, thorough irrigation was performed, deltoid split closed using 0 Vicryl suture, followed by 2-0 Vicryl suture and a 3-0 Monocryl. Impervious dressings applied.  Shoulder immobilizer applied.  The patient tolerated the procedure well without immediate complication. Transferred to recovery in stable condition.     Anderson Malta, M.D.     GSD/MEDQ  D:  02/04/2018  T:  02/05/2018  Job:  [id number]

## 2018-02-17 ENCOUNTER — Encounter (INDEPENDENT_AMBULATORY_CARE_PROVIDER_SITE_OTHER): Payer: Self-pay | Admitting: Orthopedic Surgery

## 2018-02-17 ENCOUNTER — Ambulatory Visit (INDEPENDENT_AMBULATORY_CARE_PROVIDER_SITE_OTHER): Payer: BLUE CROSS/BLUE SHIELD | Admitting: Orthopedic Surgery

## 2018-02-17 DIAGNOSIS — M75121 Complete rotator cuff tear or rupture of right shoulder, not specified as traumatic: Secondary | ICD-10-CM

## 2018-02-19 ENCOUNTER — Encounter (INDEPENDENT_AMBULATORY_CARE_PROVIDER_SITE_OTHER): Payer: Self-pay | Admitting: Orthopedic Surgery

## 2018-02-19 NOTE — Progress Notes (Signed)
   Post-Op Visit Note   Patient: Malik Ballard           Date of Birth: [birth year]           MRN: [id number] Visit Date: 02/17/2018 PCP: Leonard Downing, MD   Assessment & Plan:  Chief Complaint:  Chief Complaint  Patient presents with  . Right Shoulder - Routine Post Op   Visit Diagnoses:  1. Complete tear of right rotator cuff     Plan: Malik Ballard is a patient who is now 2 weeks out right shoulder arthroscopy rotator cuff repair.  He had 90 degrees today on the CPM.  Taking naproxen for pain.  On exam he is got good passive range of motion deltoid fires.  Continue with CPM 2-week return okay to return to work but no use of the right arm.  Follow-Up Instructions: Return in about 2 weeks (around 03/03/2018).   Orders:  No orders of the defined types were placed in this encounter.  No orders of the defined types were placed in this encounter.   Imaging: No results found.  PMFS History: Patient Active Problem List   Diagnosis Date Noted  . RECTAL BLEEDING 07/20/2008   Past Medical History:  Diagnosis Date  . Arthritis   . Cancer ([place])    skin cancer removed basal cell    History reviewed. No pertinent family history.  Past Surgical History:  Procedure Laterality Date  . removed bone spur from right foot    . SHOULDER ARTHROSCOPY WITH ROTATOR CUFF REPAIR AND SUBACROMIAL DECOMPRESSION Right 02/04/2018   Procedure: RIGHT SHOULDER ARTHROSCOPY WITH MINI-OPEN ROTATOR CUFF REPAIR, BICEPS TENODESIS, SUBACROMIAL DECOMPRESSION, CLAVICULAR SPUR REMOVAL;  Surgeon: Meredith Pel, MD;  Location: [facility];  Service: Orthopedics;  Laterality: Right;   Social History   Occupational History  . Not on file  Tobacco Use  . Smoking status: Never Smoker  . Smokeless tobacco: Never Used  Substance and Sexual Activity  . Alcohol use: No    Frequency: Never  . Drug use: No  . Sexual activity: Not on file

## 2018-03-03 ENCOUNTER — Telehealth (INDEPENDENT_AMBULATORY_CARE_PROVIDER_SITE_OTHER): Payer: Self-pay

## 2018-03-03 ENCOUNTER — Ambulatory Visit (INDEPENDENT_AMBULATORY_CARE_PROVIDER_SITE_OTHER): Payer: BLUE CROSS/BLUE SHIELD | Admitting: Orthopedic Surgery

## 2018-03-03 NOTE — Telephone Encounter (Signed)
Until they pick it up - 95 good

## 2018-03-03 NOTE — Telephone Encounter (Signed)
Patient would like to know how much longer you recommend him using the CPM for his shoulder? He is up to 95 on the machine.  Please advise. Thanks.

## 2018-03-03 NOTE — Telephone Encounter (Signed)
Can discuss with patient at his post op appt today

## 2018-03-05 ENCOUNTER — Encounter (INDEPENDENT_AMBULATORY_CARE_PROVIDER_SITE_OTHER): Payer: Self-pay | Admitting: Orthopedic Surgery

## 2018-03-05 ENCOUNTER — Ambulatory Visit (INDEPENDENT_AMBULATORY_CARE_PROVIDER_SITE_OTHER): Payer: BLUE CROSS/BLUE SHIELD | Admitting: Orthopedic Surgery

## 2018-03-05 DIAGNOSIS — M75121 Complete rotator cuff tear or rupture of right shoulder, not specified as traumatic: Secondary | ICD-10-CM

## 2018-03-09 ENCOUNTER — Encounter (INDEPENDENT_AMBULATORY_CARE_PROVIDER_SITE_OTHER): Payer: Self-pay | Admitting: Orthopedic Surgery

## 2018-03-09 NOTE — Progress Notes (Signed)
   Post-Op Visit Note   Patient: Malik Ballard           Date of Birth: 1956/04/06           MRN: [id number] Visit Date: 03/05/2018 PCP: Leonard Downing, MD   Assessment & Plan:  Chief Complaint:  Chief Complaint  Patient presents with  . Right Shoulder - Follow-up, Routine Post Op   Visit Diagnoses:  1. Complete tear of right rotator cuff     Plan: Kacen is a patient who is now about a month out right shoulder arthroscopy with biceps tenodesis and mini open rotator cuff repair.  He is not taking any medication for pain.  He is able to get a CPM machine up to 94 degrees.  On exam the repair feels good without coarse grinding or crepitus.  Strength is obviously lacking.  Discontinue sling.  Start physical therapy.  4-week return.  Do not really want him to start strengthening until 2 weeks  Follow-Up Instructions: Return in about 4 weeks (around 04/02/2018).   Orders:  No orders of the defined types were placed in this encounter.  No orders of the defined types were placed in this encounter.   Imaging: No results found.  PMFS History: Patient Active Problem List   Diagnosis Date Noted  . RECTAL BLEEDING 07/20/2008   Past Medical History:  Diagnosis Date  . Arthritis   . Cancer ([place])    skin cancer removed basal cell    History reviewed. No pertinent family history.  Past Surgical History:  Procedure Laterality Date  . removed bone spur from right foot    . SHOULDER ARTHROSCOPY WITH ROTATOR CUFF REPAIR AND SUBACROMIAL DECOMPRESSION Right 02/04/2018   Procedure: RIGHT SHOULDER ARTHROSCOPY WITH MINI-OPEN ROTATOR CUFF REPAIR, BICEPS TENODESIS, SUBACROMIAL DECOMPRESSION, CLAVICULAR SPUR REMOVAL;  Surgeon: Meredith Pel, MD;  Location: [facility];  Service: Orthopedics;  Laterality: Right;   Social History   Occupational History  . Not on file  Tobacco Use  . Smoking status: Never Smoker  . Smokeless tobacco: Never Used  Substance and Sexual Activity  . Alcohol use:  No    Frequency: Never  . Drug use: No  . Sexual activity: Not on file

## 2018-03-10 ENCOUNTER — Telehealth (INDEPENDENT_AMBULATORY_CARE_PROVIDER_SITE_OTHER): Payer: Self-pay | Admitting: Orthopedic Surgery

## 2018-03-10 NOTE — Telephone Encounter (Signed)
faxed

## 2018-03-10 NOTE — Telephone Encounter (Signed)
Mickel Duhamel from [facility] called asking for the op note/visit notes/ and script for PT to be faxed over to [phone]. Phone # [phone]

## 2018-04-07 ENCOUNTER — Encounter (INDEPENDENT_AMBULATORY_CARE_PROVIDER_SITE_OTHER): Payer: Self-pay | Admitting: Orthopedic Surgery

## 2018-04-07 ENCOUNTER — Ambulatory Visit (INDEPENDENT_AMBULATORY_CARE_PROVIDER_SITE_OTHER): Payer: BLUE CROSS/BLUE SHIELD | Admitting: Orthopedic Surgery

## 2018-04-07 DIAGNOSIS — S46011D Strain of muscle(s) and tendon(s) of the rotator cuff of right shoulder, subsequent encounter: Secondary | ICD-10-CM

## 2018-04-10 ENCOUNTER — Encounter (INDEPENDENT_AMBULATORY_CARE_PROVIDER_SITE_OTHER): Payer: Self-pay | Admitting: Orthopedic Surgery

## 2018-04-10 NOTE — Progress Notes (Signed)
   Post-Op Visit Note   Patient: Malik Ballard           Date of Birth: [birth year]           MRN: [id number] Visit Date: 04/07/2018 PCP: Leonard Downing, MD   Assessment & Plan:  Chief Complaint:  Chief Complaint  Patient presents with  . Right Shoulder - Follow-up, Routine Post Op   Visit Diagnoses:  1. Traumatic complete tear of right rotator cuff, subsequent encounter     Plan: Jw is a patient with right shoulder scope rotator cuff repair biceps tenodesis done 02/04/2018.  Doing physical therapy 2 times a week.  Getting better.  Not taking any medication for pain.  On examination he has good passive range of motion and improving strength.  I want him to be careful with his strengthening exercises.  Come back in 8 weeks for final check.  Follow-Up Instructions: Return in about 8 weeks (around 06/02/2018).   Orders:  No orders of the defined types were placed in this encounter.  No orders of the defined types were placed in this encounter.   Imaging: No results found.  PMFS History: Patient Active Problem List   Diagnosis Date Noted  . RECTAL BLEEDING 07/20/2008   Past Medical History:  Diagnosis Date  . Arthritis   . Cancer ([place])    skin cancer removed basal cell    History reviewed. No pertinent family history.  Past Surgical History:  Procedure Laterality Date  . removed bone spur from right foot    . SHOULDER ARTHROSCOPY WITH ROTATOR CUFF REPAIR AND SUBACROMIAL DECOMPRESSION Right 02/04/2018   Procedure: RIGHT SHOULDER ARTHROSCOPY WITH MINI-OPEN ROTATOR CUFF REPAIR, BICEPS TENODESIS, SUBACROMIAL DECOMPRESSION, CLAVICULAR SPUR REMOVAL;  Surgeon: Meredith Pel, MD;  Location: [place];  Service: Orthopedics;  Laterality: Right;   Social History   Occupational History  . Not on file  Tobacco Use  . Smoking status: Never Smoker  . Smokeless tobacco: Never Used  Substance and Sexual Activity  . Alcohol use: No    Frequency: Never  . Drug use: No  .  Sexual activity: Not on file

## 2018-04-14 ENCOUNTER — Encounter (INDEPENDENT_AMBULATORY_CARE_PROVIDER_SITE_OTHER): Payer: Self-pay | Admitting: Orthopedic Surgery

## 2018-04-14 ENCOUNTER — Ambulatory Visit (INDEPENDENT_AMBULATORY_CARE_PROVIDER_SITE_OTHER): Payer: BLUE CROSS/BLUE SHIELD | Admitting: Orthopedic Surgery

## 2018-04-14 DIAGNOSIS — S46011D Strain of muscle(s) and tendon(s) of the rotator cuff of right shoulder, subsequent encounter: Secondary | ICD-10-CM

## 2018-04-14 NOTE — Progress Notes (Signed)
   Post-Op Visit Note   Patient: Patsy Varma           Date of Birth: [birth year]           MRN: [id number] Visit Date: 04/14/2018 PCP: Leonard Downing, MD   Assessment & Plan:  Chief Complaint:  Chief Complaint  Patient presents with  . Right Shoulder - Pain   Visit Diagnoses:  1. Traumatic complete tear of right rotator cuff, subsequent encounter     Plan: Eulalio is a patient with right shoulder pain following rotator cuff repair about 8 weeks ago.  He was doing excellent at his last clinic visit but he overdid it in therapy last week and also a child.  On examination today he still has smooth passive range of motion at both 15 degrees of abduction and 90 degrees of abduction.  Deltoid repair appears intact.  This was the deltoid split.  Looked at this with ultrasound and everything appears to be intact although that is not a precise Architect.  Plan is for him to keep his appointment in 4 weeks.  We can reassess then the need for further imaging but at this point in time without much coarseness I think that the rotator cuff repair is intact.  Ultrasound examination demonstrated continuity of the deltoid fibers to the acromion.  I will see him back in 4 weeks.  Notably he is feeling a little bit better today than he did immediately after his therapy several days ago.  Follow-Up Instructions: No follow-ups on file.   Orders:  No orders of the defined types were placed in this encounter.  No orders of the defined types were placed in this encounter.   Imaging: No results found.  PMFS History: Patient Active Problem List   Diagnosis Date Noted  . RECTAL BLEEDING 07/20/2008   Past Medical History:  Diagnosis Date  . Arthritis   . Cancer ([place])    skin cancer removed basal cell    History reviewed. No pertinent family history.  Past Surgical History:  Procedure Laterality Date  . removed bone spur from right foot    . SHOULDER ARTHROSCOPY WITH ROTATOR CUFF REPAIR AND  SUBACROMIAL DECOMPRESSION Right 02/04/2018   Procedure: RIGHT SHOULDER ARTHROSCOPY WITH MINI-OPEN ROTATOR CUFF REPAIR, BICEPS TENODESIS, SUBACROMIAL DECOMPRESSION, CLAVICULAR SPUR REMOVAL;  Surgeon: Meredith Pel, MD;  Location: [facility];  Service: Orthopedics;  Laterality: Right;   Social History   Occupational History  . Not on file  Tobacco Use  . Smoking status: Never Smoker  . Smokeless tobacco: Never Used  Substance and Sexual Activity  . Alcohol use: No    Frequency: Never  . Drug use: No  . Sexual activity: Not on file

## 2018-06-09 ENCOUNTER — Ambulatory Visit (INDEPENDENT_AMBULATORY_CARE_PROVIDER_SITE_OTHER): Payer: BLUE CROSS/BLUE SHIELD | Admitting: Orthopedic Surgery

## 2018-06-09 ENCOUNTER — Encounter (INDEPENDENT_AMBULATORY_CARE_PROVIDER_SITE_OTHER): Payer: Self-pay | Admitting: Orthopedic Surgery

## 2018-06-09 DIAGNOSIS — M75121 Complete rotator cuff tear or rupture of right shoulder, not specified as traumatic: Secondary | ICD-10-CM

## 2018-06-09 NOTE — Progress Notes (Signed)
   Post-Op Visit Note   Patient: Malik Ballard           Date of Birth: 18-Nov-1956           MRN: [id number] Visit Date: 06/09/2018 PCP: Leonard Downing, MD   Assessment & Plan:  Chief Complaint:  Chief Complaint  Patient presents with  . Right Shoulder - Routine Post Op   Visit Diagnoses: No diagnosis found.  Plan: Husayn is a patient is now about 4 months out right shoulder arthroscopy rotator cuff repair.  He has 7 visits left in physical therapy.  He has been doing well.  He is doing weights in therapy.  Not taking any medication.  Does not have much pain.  He is doing the stairstepper to try to lose weight and he has some anterior medial right knee pain.  On examination of the right shoulder he has excellent range of motion with no coarse grinding or crepitus.  Strength is improving.  No effusion in either knee.  Plan at this time is that he is doing well since he picked up his grandchild.  That pain which he sustained at that time is now resolved.  I would like to see him back as needed.  Could consider injection in that right knee should symptoms of persist.  I think he would do well to alternate stairstepper with stationary bike for less strain on the right knee.  Follow-Up Instructions: No follow-ups on file.   Orders:  No orders of the defined types were placed in this encounter.  No orders of the defined types were placed in this encounter.   Imaging: No results found.  PMFS History: Patient Active Problem List   Diagnosis Date Noted  . RECTAL BLEEDING 07/20/2008   Past Medical History:  Diagnosis Date  . Arthritis   . Cancer ([place])    skin cancer removed basal cell    History reviewed. No pertinent family history.  Past Surgical History:  Procedure Laterality Date  . removed bone spur from right foot    . SHOULDER ARTHROSCOPY WITH ROTATOR CUFF REPAIR AND SUBACROMIAL DECOMPRESSION Right 02/04/2018   Procedure: RIGHT SHOULDER ARTHROSCOPY WITH MINI-OPEN ROTATOR  CUFF REPAIR, BICEPS TENODESIS, SUBACROMIAL DECOMPRESSION, CLAVICULAR SPUR REMOVAL;  Surgeon: Meredith Pel, MD;  Location: [facility];  Service: Orthopedics;  Laterality: Right;   Social History   Occupational History  . Not on file  Tobacco Use  . Smoking status: Never Smoker  . Smokeless tobacco: Never Used  Substance and Sexual Activity  . Alcohol use: No    Frequency: Never  . Drug use: No  . Sexual activity: Not on file

## 2018-08-08 ENCOUNTER — Encounter: Payer: Self-pay | Admitting: Gastroenterology

## 2018-08-18 IMAGING — MR MR SHOULDER*R* W/CM
6 series · 40 of 40 positions shown · IV contrast (agent unspecified)
Comparison: None.

CLINICAL DATA: Right shoulder pain.

EXAM:
MR ARTHROGRAM OF THE RIGHT SHOULDER
TECHNIQUE: Multiplanar, multisequence MR imaging of the right shoulder was
performed following the administration of intra-articular contrast.
CONTRAST:  See Injection Documentation.

[Series 4: T1 fat-sat · axial · 4.0mm · 0.25mm/px · z∈[-25,+72]mm · 6 of 24 slices shown (1 of 4)]
[im 1/24]
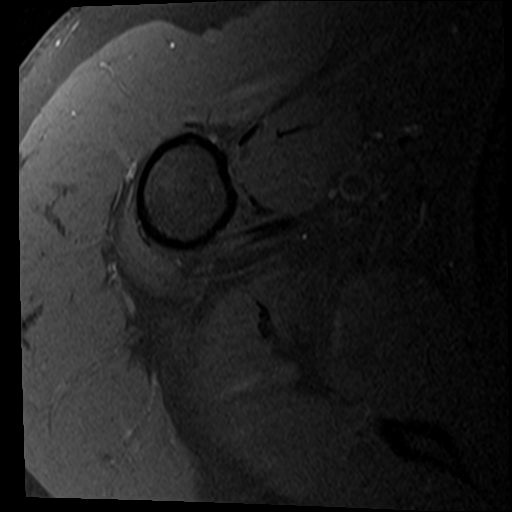
[im 5/24]
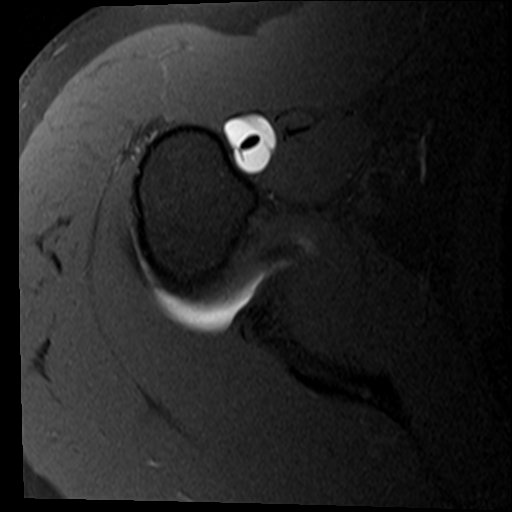
[im 10/24]
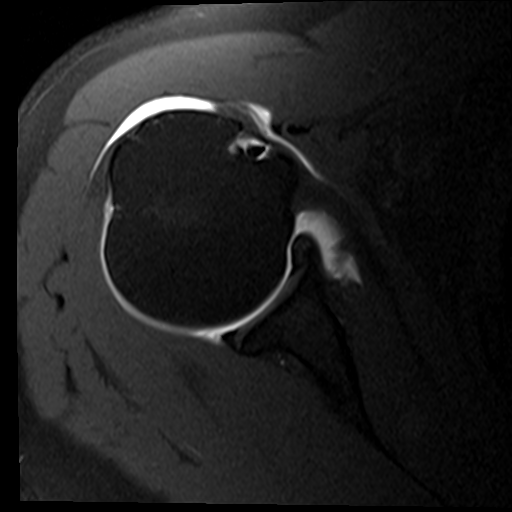
[im 14/24]
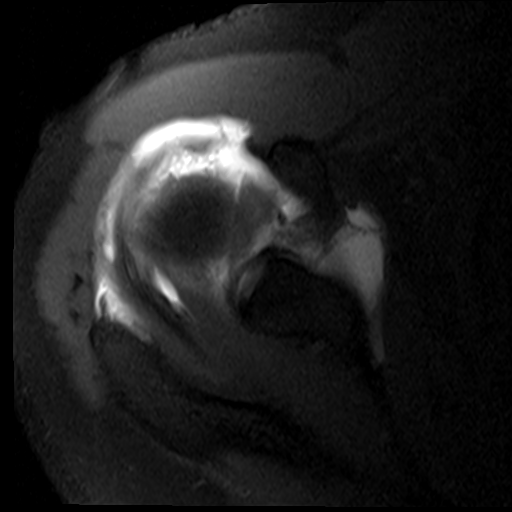
[im 19/24]
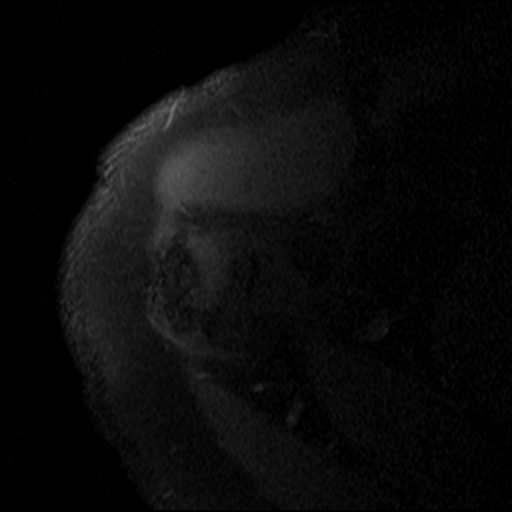
[im 24/24]
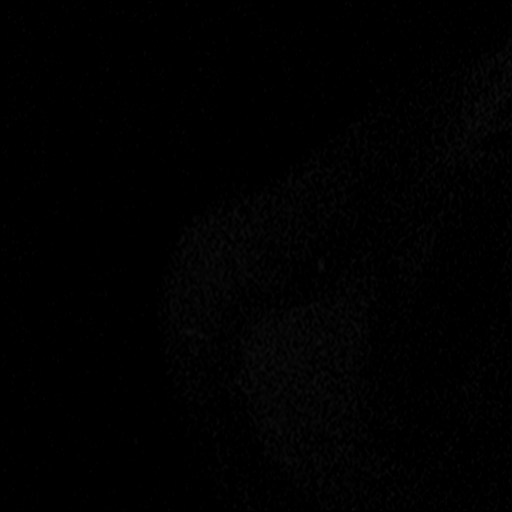

[Series 5: T2 fat-sat · oblique · 4.0mm · 0.55mm/px · 6 of 22 slices shown (1 of 2)]
[im 1/22]
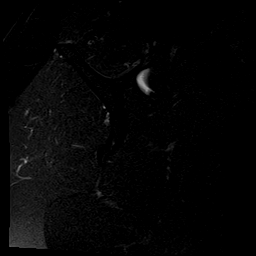
[im 5/22]
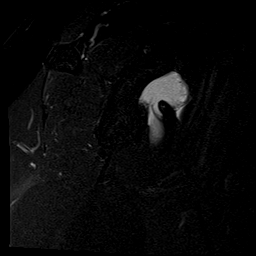
[im 9/22]
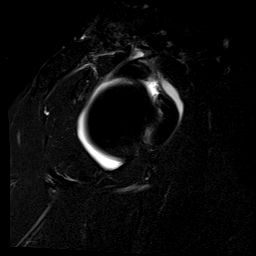
[im 13/22]
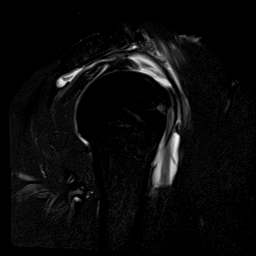
[im 17/22]
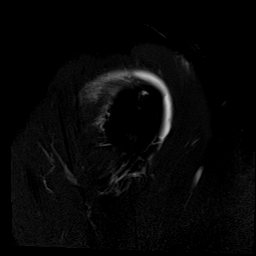
[im 22/22]
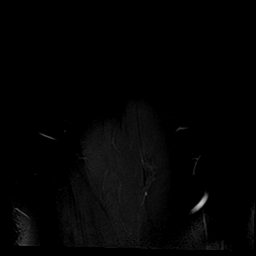

[Series 6: T1 fat-sat · oblique · 4.0mm · 0.44mm/px · 7 of 24 slices shown (2 of 4)]
[im 1/24]
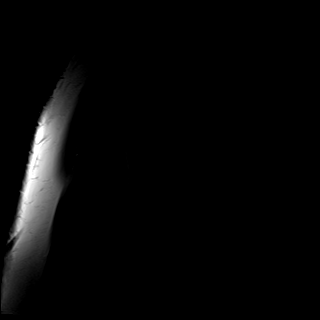
[im 4/24]
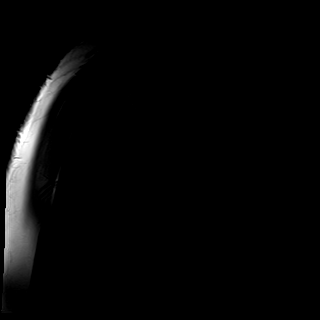
[im 8/24]
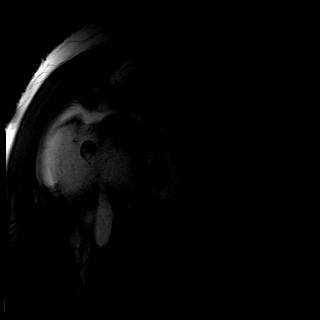
[im 12/24]
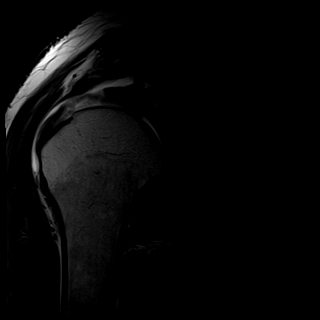
[im 16/24]
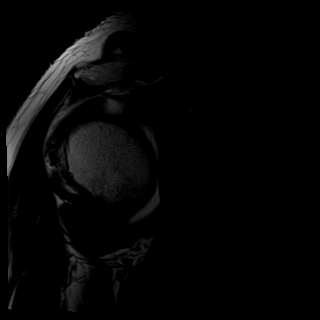
[im 20/24]
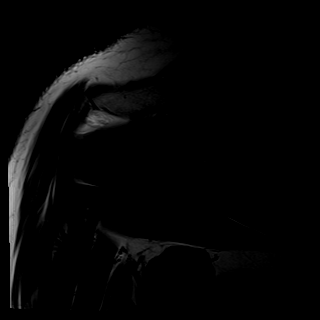
[im 24/24]
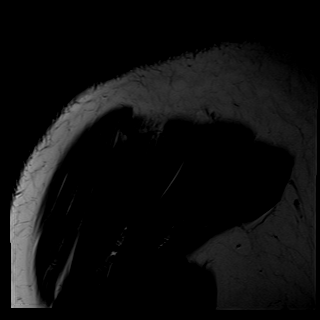

[Series 7: T1 fat-sat · oblique · 4.0mm · 0.55mm/px · 7 of 24 slices shown (3 of 4)]
[im 1/24]
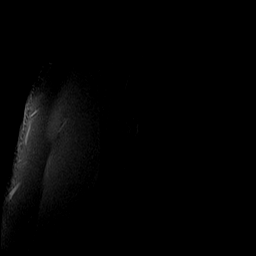
[im 4/24]
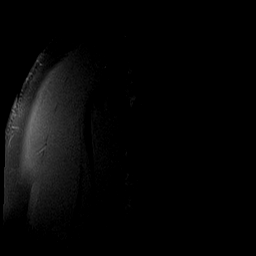
[im 8/24]
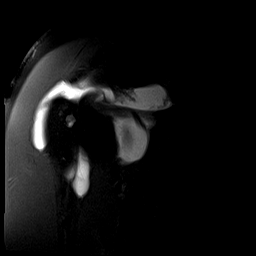
[im 12/24]
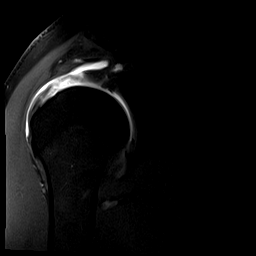
[im 16/24]
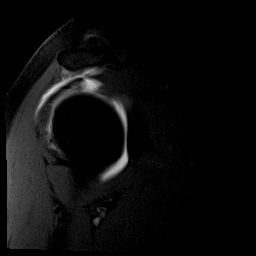
[im 20/24]
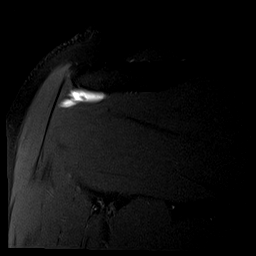
[im 24/24]
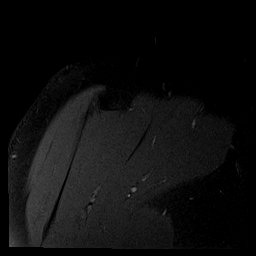

[Series 8: T2 fat-sat · oblique · 4.0mm · 0.55mm/px · 7 of 24 slices shown (2 of 2)]
[im 1/24]
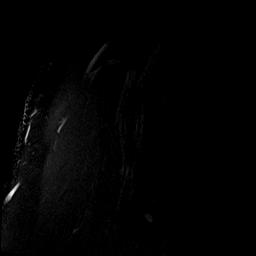
[im 4/24]
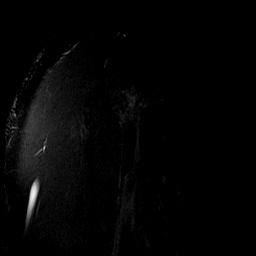
[im 8/24]
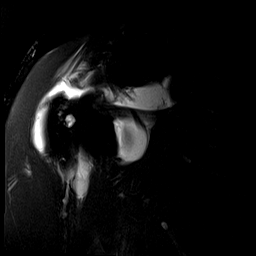
[im 12/24]
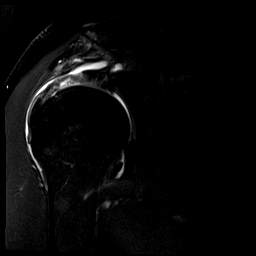
[im 16/24]
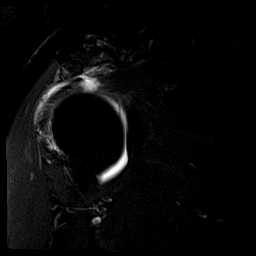
[im 20/24]
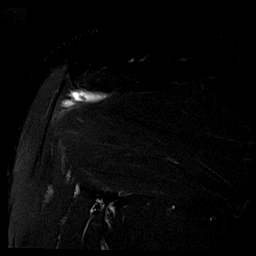
[im 24/24]
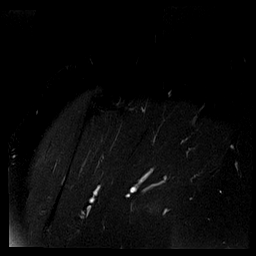

[Series 12: T1 fat-sat · sagittal · 4.0mm · 0.59mm/px · 7 of 24 slices shown (4 of 4)]
[im 1/24]
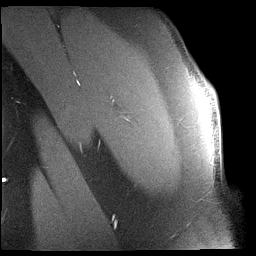
[im 4/24]
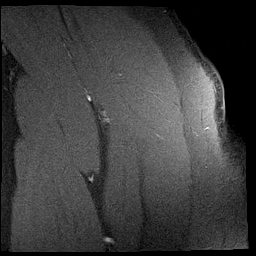
[im 8/24]
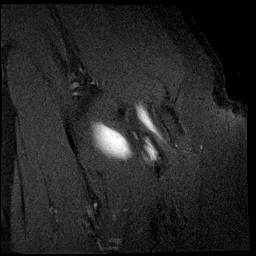
[im 12/24]
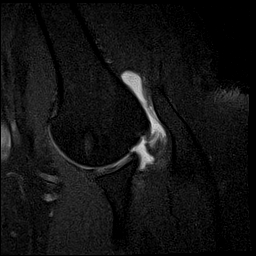
[im 16/24]
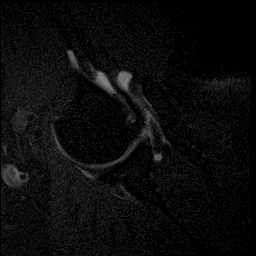
[im 20/24]
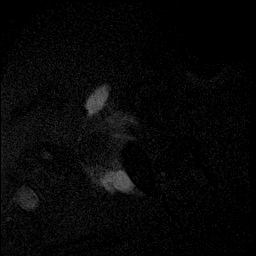
[im 24/24]
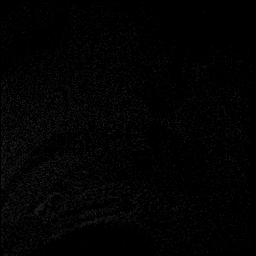

[40 of 40 positions shown; findings below may reference images not displayed]

FINDINGS: Rotator cuff: There is a 23 x 13 mm full-thickness tear of the
supraspinatus tendon. There is a thin linear tear at the anterior
edge of the infraspinatus tendon best seen on image 12 of series 5.
This is probably full-thickness. Subscapularis and teres minor
tendons are intact.

Muscles: No atrophy or edema.

Biceps long head: Properly located and intact.

Acromioclavicular Joint: Prominent AC joint arthropathy. Type 3
acromion. Inferior osteophytes at the AC joint could predispose to
impingement.

Glenohumeral Joint: Normal.

Labrum: Intact.

Bones: No significant abnormality.
IMPRESSION: 1. Large full-thickness tear of the supraspinatus tendon.
2. Linear split tear along the anterior margin of the of the
infraspinatus best seen on series [DATE]. AC joint arthropathy which could predispose to impingement.

## 2018-08-27 IMAGING — CR DG CHEST 2V
2 series · 2 of 2 positions shown · non-contrast
Comparison: No recent prior.

CLINICAL DATA: Rotator cuff surgery.  Preoperative exam.

EXAM:
CHEST  2 VIEW

[w chest pa]
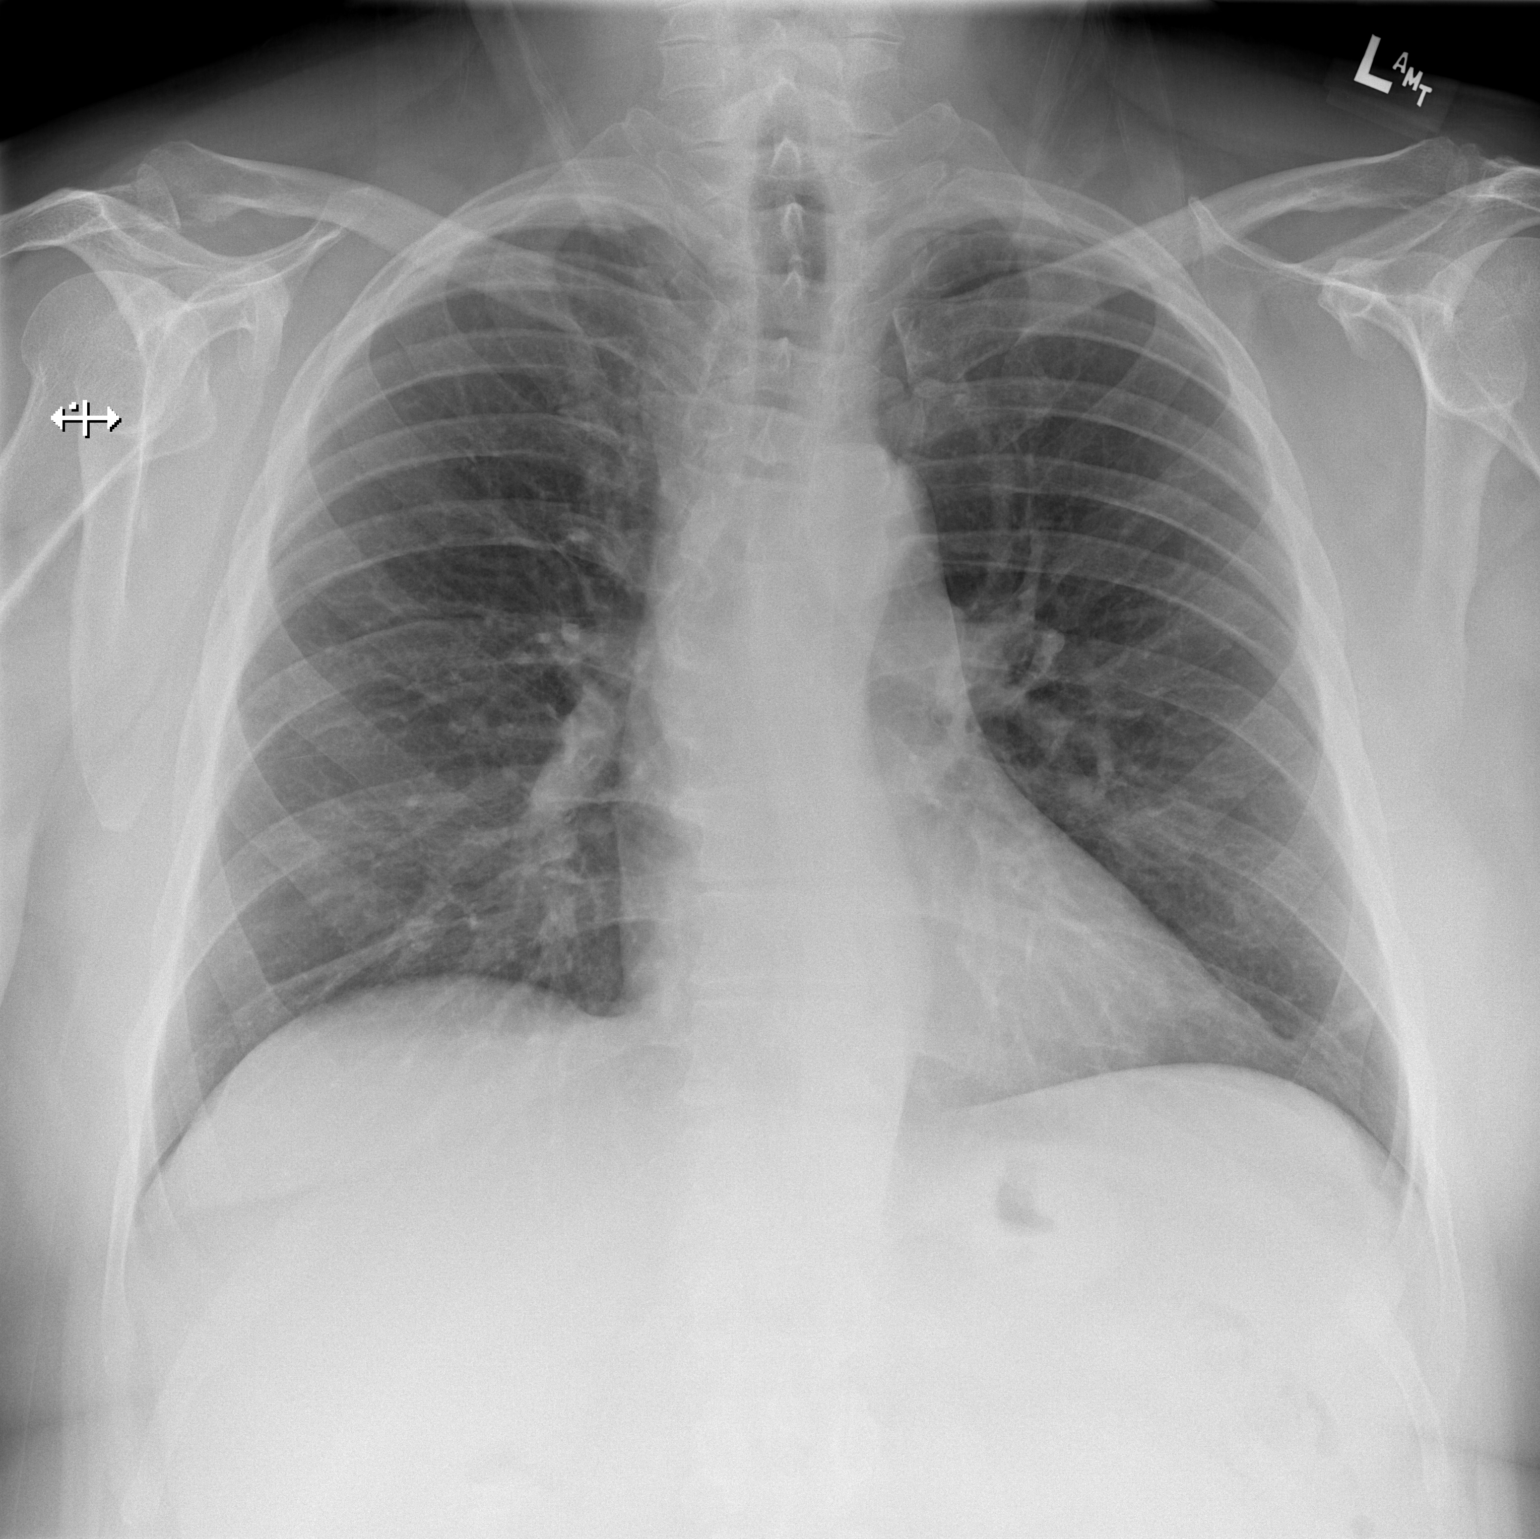

[w chest lat]
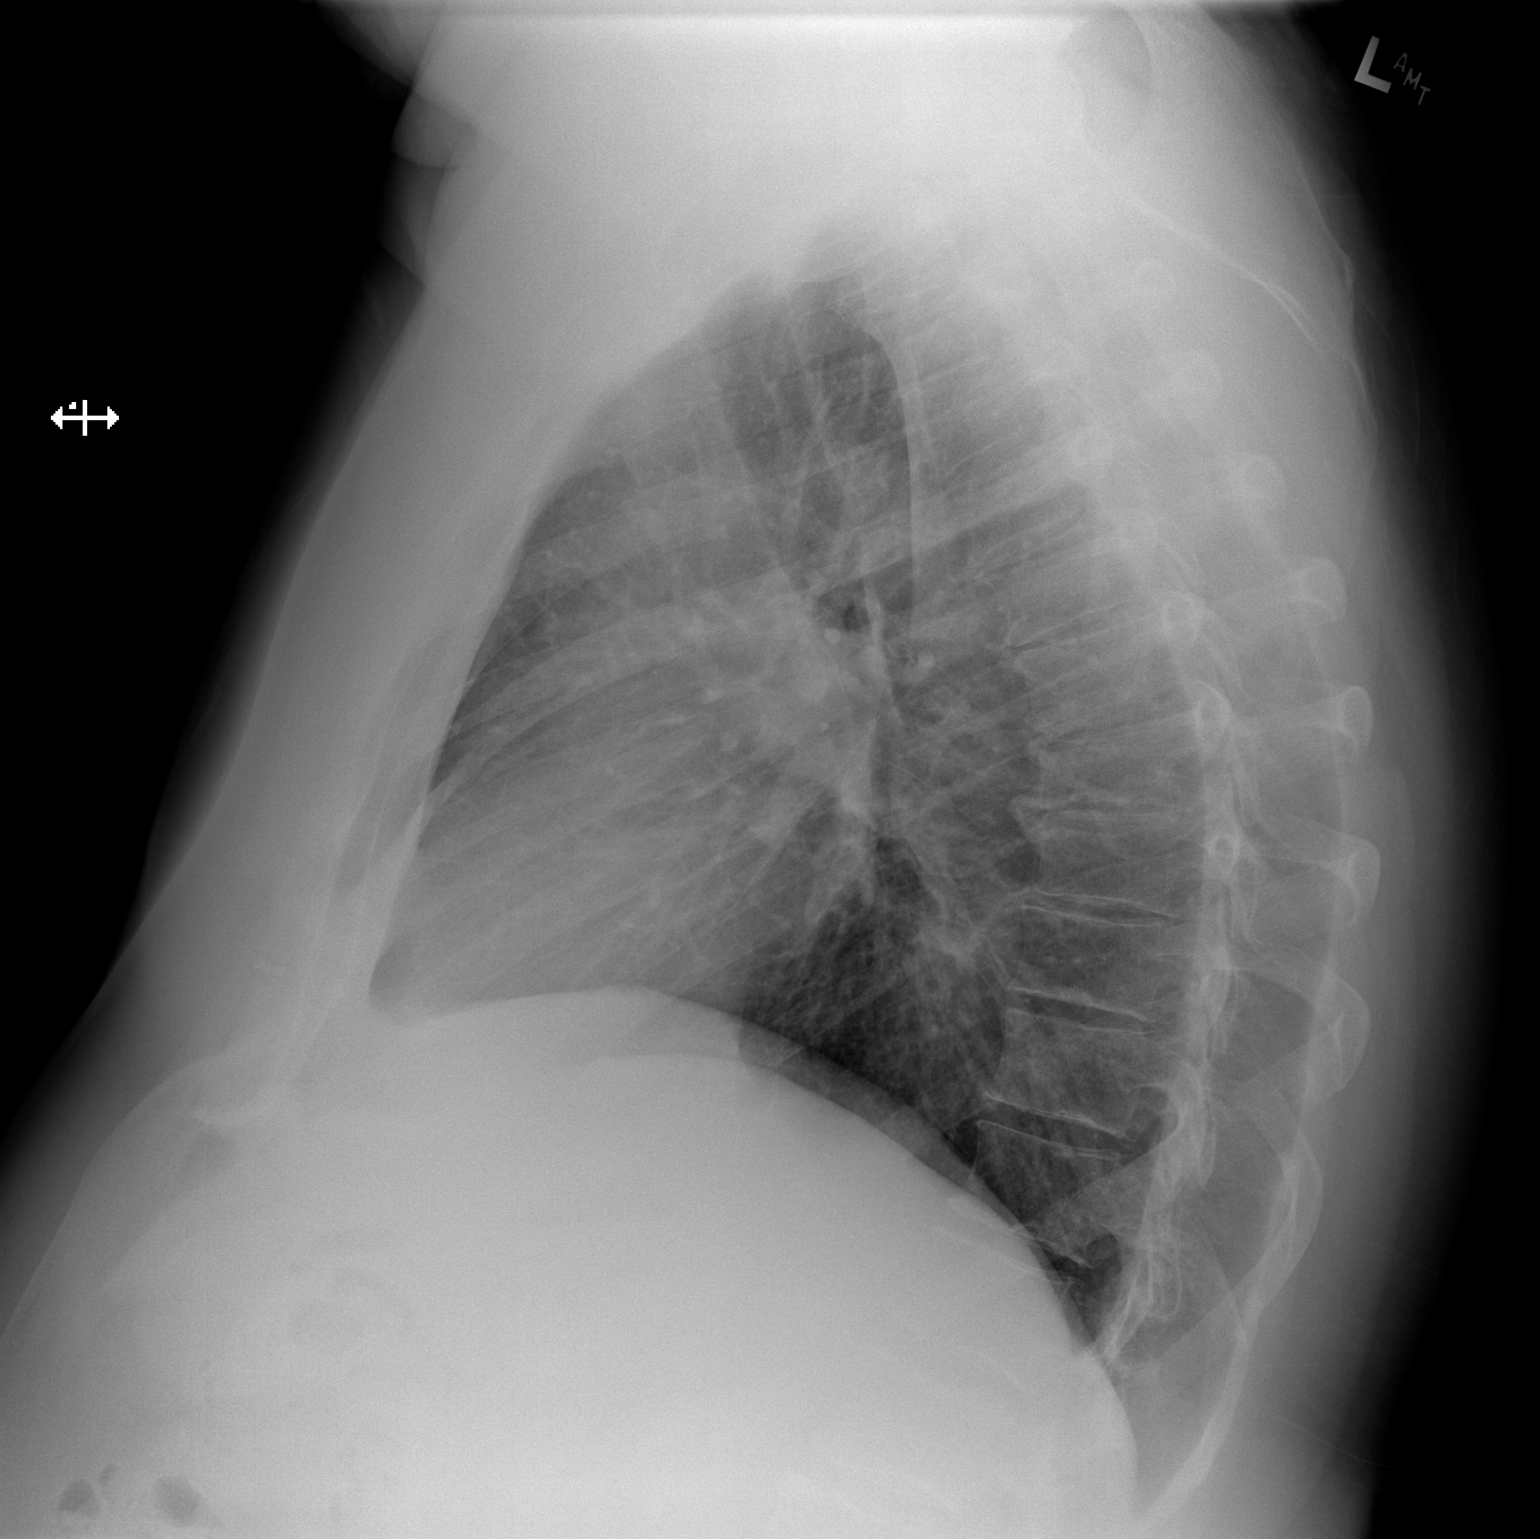

[2 of 2 positions shown; findings below may reference images not displayed]

FINDINGS: Mediastinum hilar structures normal. Low lung volumes with mild left
base subsegmental atelectasis. No focal alveolar infiltrate. No
pleural effusion or pneumothorax. Thoracic spine scoliosis.
Degenerative change thoracic spine
IMPRESSION: Low lung volumes with mild left base subsegmental atelectasis and/or
scarring.

## 2018-10-06 ENCOUNTER — Encounter: Payer: Self-pay | Admitting: Gastroenterology

## 2018-11-06 ENCOUNTER — Encounter: Payer: Self-pay | Admitting: Gastroenterology

## 2018-11-06 ENCOUNTER — Ambulatory Visit (AMBULATORY_SURGERY_CENTER): Payer: Self-pay

## 2018-11-06 ENCOUNTER — Other Ambulatory Visit: Payer: Self-pay

## 2018-11-06 VITALS — Ht 72.0 in | Wt 256.6 lb

## 2018-11-06 DIAGNOSIS — Z1211 Encounter for screening for malignant neoplasm of colon: Secondary | ICD-10-CM

## 2018-11-06 MED ORDER — NA SULFATE-K SULFATE-MG SULF 17.5-3.13-1.6 GM/177ML PO SOLN: 1.0000 | Freq: Once | ORAL | 0 refills | Status: AC

## 2018-11-06 NOTE — Progress Notes (Signed)
No egg or soy allergy known to patient  No issues with past sedation with any surgeries  or procedures, no intubation problems  No diet pills per patient No home 02 use per patient  No blood thinners per patient  Pt denies issues with constipation  No A fib or A flutter  EMMI video sent to pt's e mail , pt declined    

## 2018-11-21 ENCOUNTER — Ambulatory Visit (AMBULATORY_SURGERY_CENTER): Payer: BLUE CROSS/BLUE SHIELD | Admitting: Gastroenterology

## 2018-11-21 ENCOUNTER — Encounter: Payer: Self-pay | Admitting: Gastroenterology

## 2018-11-21 VITALS — BP 101/63 | HR 68 | Temp 98.4°F | Resp 12 | Ht 72.0 in | Wt 256.0 lb

## 2018-11-21 DIAGNOSIS — Z1211 Encounter for screening for malignant neoplasm of colon: Secondary | ICD-10-CM

## 2018-11-21 DIAGNOSIS — D122 Benign neoplasm of ascending colon: Secondary | ICD-10-CM | POA: Diagnosis not present

## 2018-11-21 DIAGNOSIS — D124 Benign neoplasm of descending colon: Secondary | ICD-10-CM

## 2018-11-21 MED ORDER — FLEET ENEMA 7-19 GM/118ML RE ENEM
1.0000 | ENEMA | Freq: Once | RECTAL | Status: AC
  Administered 2018-11-21: 1 via RECTAL

## 2018-11-21 MED ORDER — SODIUM CHLORIDE 0.9 % IV SOLN
500.0000 mL | Freq: Once | INTRAVENOUS | Status: DC
Start: 2018-11-21 — End: 2018-11-21

## 2018-11-21 NOTE — Op Note (Signed)
[place] Patient Name: Malik Ballard Procedure Date: 11/21/2018 10:18 AM MRN: [id number] Endoscopist: Remo Lipps P. Havery Moros , MD Age: 62 Referring MD:  Date of Birth: 1956/07/07 Gender: Male Account #: [id number] Procedure:                Colonoscopy Indications:              Screening for colorectal malignant neoplasm Medicines:                Monitored Anesthesia Care Procedure:                Pre-Anesthesia Assessment:                           - Prior to the procedure, a History and Physical                            was performed, and patient medications and                            allergies were reviewed. The patient's tolerance of                            previous anesthesia was also reviewed. The risks                            and benefits of the procedure and the sedation                            options and risks were discussed with the patient.                            All questions were answered, and informed consent                            was obtained. Prior Anticoagulants: The patient has                            taken no previous anticoagulant or antiplatelet                            agents. ASA Grade Assessment: I - A normal, healthy                            patient. After reviewing the risks and benefits,                            the patient was deemed in satisfactory condition to                            undergo the procedure.                           After obtaining informed consent, the colonoscope  was passed under direct vision. Throughout the                            procedure, the patient's blood pressure, pulse, and                            oxygen saturations were monitored continuously. The                            Colonoscope was introduced through the anus and                            advanced to the the terminal ileum, with                            identification of the appendiceal  orifice and IC                            valve. The colonoscopy was performed without                            difficulty. The patient tolerated the procedure                            well. The quality of the bowel preparation was                            good. The terminal ileum, ileocecal valve,                            appendiceal orifice, and rectum were photographed. Scope In: 10:22:57 AM Scope Out: 10:42:13 AM Scope Withdrawal Time: 0 hours 15 minutes 43 seconds  Total Procedure Duration: 0 hours 19 minutes 16 seconds  Findings:                 The perianal and digital rectal examinations were                            normal.                           The terminal ileum appeared normal.                           Two sessile polyps were found in the ascending                            colon. The polyps were 4 mm in size. These polyps                            were removed with a cold snare. Resection and                            retrieval were complete.  A 5 mm polyp was found in the descending colon. The                            polyp was sessile. The polyp was removed with a                            cold snare. Resection and retrieval were complete.                           Scattered small-mouthed diverticula were found in                            the transverse colon and left colon.                           Internal hemorrhoids were found during retroflexion.                           The exam was otherwise without abnormality. Complications:            No immediate complications. Estimated blood loss:                            Minimal. Estimated Blood Loss:     Estimated blood loss was minimal. Impression:               - The examined portion of the ileum was normal.                           - Two 4 mm polyps in the ascending colon, removed                            with a cold snare. Resected and retrieved.                            - One 5 mm polyp in the descending colon, removed                            with a cold snare. Resected and retrieved.                           - Diverticulosis in the transverse colon and in the                            left colon.                           - Internal hemorrhoids.                           - The examination was otherwise normal. Recommendation:           - Patient has a contact number available for  emergencies. The signs and symptoms of potential                            delayed complications were discussed with the                            patient. Return to normal activities tomorrow.                            Written discharge instructions were provided to the                            patient.                           - Resume previous diet.                           - Continue present medications.                           - Await pathology results. Remo Lipps P. Netanya Yazdani, MD 11/21/2018 10:46:50 AM This report has been signed electronically.

## 2018-11-21 NOTE — Progress Notes (Signed)
Called to room to assist during endoscopic procedure.  Patient ID and intended procedure confirmed with present staff. Received instructions for my participation in the procedure from the performing physician.  

## 2018-11-21 NOTE — Progress Notes (Signed)
Small Yellow liquid results after enema given.

## 2018-11-21 NOTE — Progress Notes (Signed)
Patient states he has cloudy brown stool results with the prep. Dr. Havery Moros notified and an order for an enema was given and administered. Sm

## 2018-11-21 NOTE — Progress Notes (Signed)
Report given to PACU, vss 

## 2018-11-21 NOTE — Progress Notes (Signed)
Pt's states no medical or surgical changes since previsit or office visit. 

## 2018-11-21 NOTE — Patient Instructions (Signed)
Handouts Provided:  Polyps, Hemorrhoids, and Diverticulosis  YOU HAD AN ENDOSCOPIC PROCEDURE TODAY AT [facility]:   Refer to the procedure report that was given to you for any specific questions about what was found during the examination.  If the procedure report does not answer your questions, please call your gastroenterologist to clarify.  If you requested that your care partner not be given the details of your procedure findings, then the procedure report has been included in a sealed envelope for you to review at your convenience later.  YOU SHOULD EXPECT: Some feelings of bloating in the abdomen. Passage of more gas than usual.  Walking can help get rid of the air that was put into your GI tract during the procedure and reduce the bloating. If you had a lower endoscopy (such as a colonoscopy or flexible sigmoidoscopy) you may notice spotting of blood in your stool or on the toilet paper. If you underwent a bowel prep for your procedure, you may not have a normal bowel movement for a few days.  Please Note:  You might notice some irritation and congestion in your nose or some drainage.  This is from the oxygen used during your procedure.  There is no need for concern and it should clear up in a day or so.  SYMPTOMS TO REPORT IMMEDIATELY:   Following lower endoscopy (colonoscopy or flexible sigmoidoscopy):  Excessive amounts of blood in the stool  Significant tenderness or worsening of abdominal pains  Swelling of the abdomen that is new, acute  Fever of 100F or higher  For urgent or emergent issues, a gastroenterologist can be reached at any hour by calling [phone].   DIET:  We do recommend a small meal at first, but then you may proceed to your regular diet.  Drink plenty of fluids but you should avoid alcoholic beverages for 24 hours.  ACTIVITY:  You should plan to take it easy for the rest of today and you should NOT DRIVE or use heavy machinery until tomorrow  (because of the sedation medicines used during the test).    FOLLOW UP: Our staff will call the number listed on your records the next business day following your procedure to check on you and address any questions or concerns that you may have regarding the information given to you following your procedure. If we do not reach you, we will leave a message.  However, if you are feeling well and you are not experiencing any problems, there is no need to return our call.  We will assume that you have returned to your regular daily activities without incident.  If any biopsies were taken you will be contacted by phone or by letter within the next 1-3 weeks.  Please call us at [phone] if you have not heard about the biopsies in 3 weeks.    SIGNATURES/CONFIDENTIALITY: You and/or your care partner have signed paperwork which will be entered into your electronic medical record.  These signatures attest to the fact that that the information above on your After Visit Summary has been reviewed and is understood.  Full responsibility of the confidentiality of this discharge information lies with you and/or your care-partner.

## 2018-11-24 ENCOUNTER — Telehealth: Payer: Self-pay | Admitting: *Deleted

## 2018-11-24 ENCOUNTER — Telehealth: Payer: Self-pay

## 2018-11-24 NOTE — Telephone Encounter (Signed)
  Follow up Call-  Call back number 11/21/2018  Post procedure Call Back phone  # [phone]  Permission to leave phone message Yes  Some recent data might be hidden     Patient questions:  Do you have a fever, pain , or abdominal swelling? No. Pain Score  0 *  Have you tolerated food without any problems? Yes.    Have you been able to return to your normal activities? Yes.    Do you have any questions about your discharge instructions: Diet   No. Medications  No. Follow up visit  No.  Do you have questions or concerns about your Care? No.  Actions: * If pain score is 4 or above: No action needed, pain <4.

## 2018-11-24 NOTE — Telephone Encounter (Signed)
-----   Message from Yetta Flock, MD sent at 11/21/2018  4:52 PM EST ----- Regarding: another banding Jan can you also call this patient sometime in the next week to schedule a banding? Thanks

## 2018-11-24 NOTE — Telephone Encounter (Signed)
Thanks Jan 

## 2018-11-24 NOTE — Telephone Encounter (Signed)
Called and spoke to pt.  He said he is not having problems with his hemorrhoids and is not interested in bandings at this time.

## 2018-11-26 ENCOUNTER — Encounter: Payer: Self-pay | Admitting: Gastroenterology

## 2018-12-10 ENCOUNTER — Telehealth: Payer: Self-pay | Admitting: Gastroenterology

## 2018-12-11 NOTE — Telephone Encounter (Signed)
Dr. Havery Moros does not have any appts the rest of December.

## 2018-12-11 NOTE — Telephone Encounter (Signed)
Called and spoke to pt.  Scheduled him for January and put him on the wait list.

## 2018-12-19 ENCOUNTER — Ambulatory Visit (INDEPENDENT_AMBULATORY_CARE_PROVIDER_SITE_OTHER): Payer: BLUE CROSS/BLUE SHIELD | Admitting: Orthopedic Surgery

## 2018-12-19 ENCOUNTER — Ambulatory Visit (INDEPENDENT_AMBULATORY_CARE_PROVIDER_SITE_OTHER): Payer: Self-pay

## 2018-12-19 ENCOUNTER — Encounter (INDEPENDENT_AMBULATORY_CARE_PROVIDER_SITE_OTHER): Payer: Self-pay | Admitting: Orthopedic Surgery

## 2018-12-19 DIAGNOSIS — M546 Pain in thoracic spine: Secondary | ICD-10-CM

## 2018-12-19 DIAGNOSIS — M545 Low back pain, unspecified: Secondary | ICD-10-CM

## 2018-12-19 DIAGNOSIS — M4807 Spinal stenosis, lumbosacral region: Secondary | ICD-10-CM | POA: Diagnosis not present

## 2018-12-19 DIAGNOSIS — G8929 Other chronic pain: Secondary | ICD-10-CM | POA: Diagnosis not present

## 2018-12-21 ENCOUNTER — Encounter (INDEPENDENT_AMBULATORY_CARE_PROVIDER_SITE_OTHER): Payer: Self-pay | Admitting: Orthopedic Surgery

## 2018-12-21 NOTE — Progress Notes (Signed)
Office Visit Note   Patient: Malik Ballard           Date of Birth: 1956/05/05           MRN: [id number] Visit Date: 12/19/2018 Requested by: Leonard Downing, MD [place], [place] [address] PCP: Leonard Downing, MD  Subjective: Chief Complaint  Patient presents with  . Middle Back - Pain  . Lower Back - Pain    HPI: Malik Ballard is a patient with over a year history of low back pain which is acutely worsening over the past 2 months.  He did lose 30 pounds this year which is increasing his pain.  He states that his back gave out about 6 weeks ago when he was bending over a trailer.  The pain does radiate down his legs.  Has a lot of burning pain primarily on the right-hand side.  He is tried muscle relaxers which have not helped.  Describes a lot of fatigue in his back and legs at the end of the day.  Does report some pain with coughing and sneezing.  He has been able to work during his symptoms.              ROS: All systems reviewed are negative as they relate to the chief complaint within the history of present illness.  Patient denies  fevers or chills.   Assessment & Plan: Visit Diagnoses:  1. Chronic bilateral low back pain, unspecified whether sciatica present   2. Thoracic back pain, unspecified back pain laterality, unspecified chronicity   3. Spinal stenosis of lumbosacral region     Plan: Impression is low back pain with radiation to the right leg.  I think he may have a component of meralgia paresthetica on the right-hand side because that is improving with weight loss.  Nonetheless he is having significant lower back pain with posterior radiation as well.'s been going on for over a year and he is failed conservative management.  Would like to get MRI scan done with possible ESI to follow.  I will see him back after that study.  Follow-Up Instructions: Return for after MRI.   Orders:  Orders Placed This Encounter  Procedures  . XR Lumbar Spine 2-3  Views  . XR Thoracic Spine 2 View  . MR LUMBAR SPINE WO CONTRAST   No orders of the defined types were placed in this encounter.     Procedures: No procedures performed   Clinical Data: No additional findings.  Objective: Vital Signs: There were no vitals taken for this visit.  Physical Exam:   Constitutional: Patient appears well-developed HEENT:  Head: Normocephalic Eyes:EOM are normal Neck: Normal range of motion Cardiovascular: Normal rate Pulmonary/chest: Effort normal Neurologic: Patient is alert Skin: Skin is warm Psychiatric: Patient has normal mood and affect    Ortho Exam: Ortho exam demonstrates normal gait alignment.  Does have mildly positive nerve root tension signs on the right negative on the left.  Some lateral femoral cutaneous nerve paresthesias on the right-hand side but not the left.  Patient has 5 out of 5 ankle dorsiflexion plantarflexion quad hamstring strength with no pain with internal or external rotation of the leg.  Some pain with forward and lateral bending but no trochanteric tenderness is noted.  No other masses lymphadenopathy or skin changes noted in the back region.  Specialty Comments:  No specialty comments available.  Imaging: No results found.   PMFS History: Patient Active Problem List  Diagnosis Date Noted  . RECTAL BLEEDING 07/20/2008   Past Medical History:  Diagnosis Date  . Arthritis   . Cancer ([place])    skin cancer removed basal cell    Family History  Problem Relation Age of Onset  . Colon cancer Neg Hx   . Esophageal cancer Neg Hx   . Prostate cancer Neg Hx   . Rectal cancer Neg Hx     Past Surgical History:  Procedure Laterality Date  . COLONOSCOPY    . POLYPECTOMY    . removed bone spur from right foot    . SHOULDER ARTHROSCOPY WITH ROTATOR CUFF REPAIR AND SUBACROMIAL DECOMPRESSION Right 02/04/2018   Procedure: RIGHT SHOULDER ARTHROSCOPY WITH MINI-OPEN ROTATOR CUFF REPAIR, BICEPS TENODESIS, SUBACROMIAL  DECOMPRESSION, CLAVICULAR SPUR REMOVAL;  Surgeon: Meredith Pel, MD;  Location: [facility];  Service: Orthopedics;  Laterality: Right;   Social History   Occupational History  . Not on file  Tobacco Use  . Smoking status: Never Smoker  . Smokeless tobacco: Never Used  Substance and Sexual Activity  . Alcohol use: No    Frequency: Never  . Drug use: No  . Sexual activity: Not on file

## 2018-12-26 ENCOUNTER — Ambulatory Visit
Admission: RE | Admit: 2018-12-26 | Discharge: 2018-12-26 | Disposition: A | Discharge status: Home / Self Care | Payer: BLUE CROSS/BLUE SHIELD | Source: Ambulatory Visit | Attending: Orthopedic Surgery | Admitting: Orthopedic Surgery

## 2018-12-26 DIAGNOSIS — M4807 Spinal stenosis, lumbosacral region: Secondary | ICD-10-CM

## 2019-01-08 ENCOUNTER — Ambulatory Visit (INDEPENDENT_AMBULATORY_CARE_PROVIDER_SITE_OTHER): Payer: BLUE CROSS/BLUE SHIELD | Admitting: Gastroenterology

## 2019-01-08 ENCOUNTER — Encounter

## 2019-01-08 ENCOUNTER — Encounter: Payer: Self-pay | Admitting: Gastroenterology

## 2019-01-08 VITALS — BP 128/76 | HR 89 | Ht 71.25 in | Wt 270.0 lb

## 2019-01-08 DIAGNOSIS — K640 First degree hemorrhoids: Secondary | ICD-10-CM

## 2019-01-08 NOTE — Progress Notes (Signed)
63 y/o male here for therapy of hemorrhoids. He has had intermittent bleeding, irritation, and occasional leakage from hemorrhoids. I have discussed options for therapy with him and he wishes to proceed with banding after discussion of risks / benefits. Colonoscopy as outlined below.  Colonoscopy 11/21/2018 - 3 small adenomas, diverticulosis, internal hemorrhoids   PROCEDURE NOTE: The patient presents with symptomatic grade I  hemorrhoids, requesting rubber band ligation of his/her hemorrhoidal disease.  All risks, benefits and alternative forms of therapy were described and informed consent was obtained.   The anorectum was pre-medicated with 0.125% nitroglycerin The decision was made to band the LL internal hemorrhoid, and the [place] was used to perform band ligation without complication.  Digital anorectal examination was then performed to assure proper positioning of the band, and to adjust the banded tissue as required.  The patient was discharged home without pain or other issues.  Dietary and behavioral recommendations were given and along with follow-up instructions.     The patient will return in 2-4 weeks  for  follow-up and possible additional banding as required. No complications were encountered and the patient tolerated the procedure well.  [place] Cellar, MD [facility]enter[facility]

## 2019-01-08 NOTE — Patient Instructions (Signed)
Normal BMI (Body Mass Index- based on height and weight) is between 19 and 25. Your BMI today is Body mass index is 37.39 kg/m. Marland Kitchen Please consider follow up  regarding your BMI with your Primary Care Provider.  HEMORRHOID BANDING PROCEDURE    FOLLOW-UP CARE   1. The procedure you have had should have been relatively painless since the banding of the area involved does not have nerve endings and there is no pain sensation.  The rubber band cuts off the blood supply to the hemorrhoid and the band may fall off as soon as 48 hours after the banding (the band may occasionally be seen in the toilet bowl following a bowel movement). You may notice a temporary feeling of fullness in the rectum which should respond adequately to plain Tylenol or Motrin.  2. Following the banding, avoid strenuous exercise that evening and resume full activity the next day.  A sitz bath (soaking in a warm tub) or bidet is soothing, and can be useful for cleansing the area after bowel movements.     3. To avoid constipation, take two tablespoons of natural wheat bran, natural oat bran, flax, Benefiber or any over the counter fiber supplement and increase your water intake to 7-8 glasses daily.    4. Unless you have been prescribed anorectal medication, do not put anything inside your rectum for two weeks: No suppositories, enemas, fingers, etc.  5. Occasionally, you may have more bleeding than usual after the banding procedure.  This is often from the untreated hemorrhoids rather than the treated one.  Don't be concerned if there is a tablespoon or so of blood.  If there is more blood than this, lie flat with your bottom higher than your head and apply an ice pack to the area. If the bleeding does not stop within a half an hour or if you feel faint, call our office at [phone] or go to the emergency room.  6. Problems are not common; however, if there is a substantial amount of bleeding, severe pain, chills, fever or  difficulty passing urine (very rare) or other problems, you should call us at [phone] or report to the nearest emergency room.  7. Do not stay seated continuously for more than 2-3 hours for a day or two after the procedure.  Tighten your buttock muscles 10-15 times every two hours and take 10-15 deep breaths every 1-2 hours.  Do not spend more than a few minutes on the toilet if you cannot empty your bowel; instead re-visit the toilet at a later time.    Your next banding appointments are Monday, 01-26-19 at 4:00pm and Wednesday, 02-18-19 at 4:00pm.  Please let us know if you are unable to keep either of these appointments:  [phone].  Thank you for entrusting me with your care and for choosing [facility], Dr. [place] Cellar

## 2019-01-09 ENCOUNTER — Other Ambulatory Visit (INDEPENDENT_AMBULATORY_CARE_PROVIDER_SITE_OTHER): Payer: Self-pay

## 2019-01-09 ENCOUNTER — Encounter (INDEPENDENT_AMBULATORY_CARE_PROVIDER_SITE_OTHER): Payer: Self-pay | Admitting: Orthopedic Surgery

## 2019-01-09 ENCOUNTER — Ambulatory Visit (INDEPENDENT_AMBULATORY_CARE_PROVIDER_SITE_OTHER): Payer: BLUE CROSS/BLUE SHIELD | Admitting: Orthopedic Surgery

## 2019-01-09 DIAGNOSIS — M546 Pain in thoracic spine: Secondary | ICD-10-CM

## 2019-01-09 DIAGNOSIS — M5442 Lumbago with sciatica, left side: Secondary | ICD-10-CM

## 2019-01-10 ENCOUNTER — Encounter (INDEPENDENT_AMBULATORY_CARE_PROVIDER_SITE_OTHER): Payer: Self-pay | Admitting: Orthopedic Surgery

## 2019-01-10 NOTE — Progress Notes (Signed)
Office Visit Note   Patient: Malik Ballard           Date of Birth: 1956-03-10           MRN: [id number] Visit Date: 01/09/2019 Requested by: Leonard Downing, MD [place],  [id number] PCP: Leonard Downing, MD  Subjective: Chief Complaint  Patient presents with  . Spine - Follow-up  . Results    HPI: Malik Ballard is a patient with bilateral hip pain.  Reports achiness rating down both legs.  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  He walks daily but he cannot really work all day because of pain in his back and legs.  Takes occasional over-the-counter medication but he is not much of a medication taker.  He does have a history of going to a chiropractor.              ROS: All systems reviewed are negative as they relate to the chief complaint within the history of present illness.  Patient denies  fevers or chills.   Assessment & Plan: Visit Diagnoses:  1. 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  Reviewed the MRI scan with [facility].  Does not look like a surgical problem; however I do think he would benefit from some injections with physical therapy to follow.  We will set him up with Dr. Ernestina Patches for [facility] x1 and then we will see if we can get him into a home exercise with physical therapy.  Follow-Up Instructions: No follow-ups on file.   Orders:  No orders of the defined types were placed in this encounter.  No orders of the defined types were placed in this encounter.     Procedures: No procedures performed   Clinical Data: No additional findings.  Objective: Vital Signs: There were no vitals taken for this visit.  Physical Exam:   Constitutional: Patient appears well-developed HEENT:  Head: Normocephalic Eyes:EOM are normal Neck: Normal range of motion Cardiovascular: Normal  rate Pulmonary/chest: Effort normal Neurologic: Patient is alert Skin: Skin is warm Psychiatric: Patient has normal mood and affect    Ortho Exam: Ortho exam demonstrates full active and passive range of motion of that right and left hip.  No nerve root tension signs today no paresthesias L1 S1 bilaterally.  Gait is normal.  Does have a little bit of pain with forward and lateral bending.  Specialty Comments:  No specialty comments available.  Imaging: No results found.   PMFS History: Patient Active Problem List   Diagnosis Date Noted  . RECTAL BLEEDING 07/20/2008   Past Medical History:  Diagnosis Date  . Arthritis   . Cancer ([place])    skin cancer removed basal cell    Family History  Problem Relation Age of Onset  . Colon cancer Neg Hx   . Esophageal cancer Neg Hx   . Prostate cancer Neg Hx   . Rectal cancer Neg Hx     Past Surgical History:  Procedure Laterality Date  . COLONOSCOPY    . POLYPECTOMY    . removed bone spur from right foot    . SHOULDER ARTHROSCOPY WITH ROTATOR CUFF REPAIR AND SUBACROMIAL DECOMPRESSION Right 02/04/2018   Procedure: RIGHT SHOULDER ARTHROSCOPY WITH MINI-OPEN ROTATOR CUFF REPAIR, BICEPS TENODESIS, SUBACROMIAL DECOMPRESSION, CLAVICULAR SPUR REMOVAL;  Surgeon: Meredith Pel, MD;  Location: [facility]  OR;  Service: Orthopedics;  Laterality: Right;   Social History   Occupational History  . Not on file  Tobacco Use  . Smoking status: Never Smoker  . Smokeless tobacco: Never Used  Substance and Sexual Activity  . Alcohol use: No    Frequency: Never  . Drug use: No  . Sexual activity: Not on file

## 2019-01-26 ENCOUNTER — Ambulatory Visit (INDEPENDENT_AMBULATORY_CARE_PROVIDER_SITE_OTHER): Payer: BLUE CROSS/BLUE SHIELD | Admitting: Gastroenterology

## 2019-01-26 ENCOUNTER — Encounter: Payer: Self-pay | Admitting: Gastroenterology

## 2019-01-26 VITALS — BP 142/84 | HR 76 | Ht 71.25 in | Wt 276.1 lb

## 2019-01-26 DIAGNOSIS — K640 First degree hemorrhoids: Secondary | ICD-10-CM

## 2019-01-26 NOTE — Progress Notes (Signed)
   63 y/o male here for therapy of hemorrhoids. He has had intermittent bleeding, irritation, and occasional leakage from hemorrhoids. Banded LL hemorrhoid on 01/08/18. He did have some spasm later in the day following initial banding, he did not contact me about it, got better with some. Generally has done well since first banding and symptoms are all improved.  I have discussed options for therapy with him and he wishes to continue with banding after discussion of risks / benefits. Colonoscopy as outlined below.  Colonoscopy 11/21/2018 - 3 small adenomas, diverticulosis, internal hemorrhoids  PROCEDURE NOTE: The patient presents with symptomatic grade I  hemorrhoids, requesting rubber band ligation of his/her hemorrhoidal disease.  All risks, benefits and alternative forms of therapy were described and informed consent was obtained.  The anorectum was pre-medicated with 0.125% nitroglycerin The decision was made to band the RP internal hemorrhoid, and the [place] was used to perform band ligation without complication.  Digital anorectal examination was then performed to assure proper positioning of the band, and to adjust the banded tissue as required.  The patient was discharged home without pain or other issues.  Dietary and behavioral recommendations were given and along with follow-up instructions.     The patient will return in 2-4 weeks for  follow-up and possible additional banding as required. No complications were encountered and the patient tolerated the procedure well.  [place] Cellar, MD [facility]enter[facility]

## 2019-01-26 NOTE — Patient Instructions (Signed)
If you are age 61 or older, your body mass index should be between 23-30. Your Body mass index is 38.24 kg/m. If this is out of the aforementioned range listed, please consider follow up with your Primary Care Provider.  If you are age 23 or younger, your body mass index should be between 19-25. Your Body mass index is 38.24 kg/m. If this is out of the aformentioned range listed, please consider follow up with your Primary Care Provider.    HEMORRHOID BANDING PROCEDURE    FOLLOW-UP CARE   1. The procedure you have had should have been relatively painless since the banding of the area involved does not have nerve endings and there is no pain sensation.  The rubber band cuts off the blood supply to the hemorrhoid and the band may fall off as soon as 48 hours after the banding (the band may occasionally be seen in the toilet bowl following a bowel movement). You may notice a temporary feeling of fullness in the rectum which should respond adequately to plain Tylenol or Motrin.  2. Following the banding, avoid strenuous exercise that evening and resume full activity the next day.  A sitz bath (soaking in a warm tub) or bidet is soothing, and can be useful for cleansing the area after bowel movements.     3. To avoid constipation, take two tablespoons of natural wheat bran, natural oat bran, flax, Benefiber or any over the counter fiber supplement and increase your water intake to 7-8 glasses daily.    4. Unless you have been prescribed anorectal medication, do not put anything inside your rectum for two weeks: No suppositories, enemas, fingers, etc.  5. Occasionally, you may have more bleeding than usual after the banding procedure.  This is often from the untreated hemorrhoids rather than the treated one.  Don't be concerned if there is a tablespoon or so of blood.  If there is more blood than this, lie flat with your bottom higher than your head and apply an ice pack to the area. If the  bleeding does not stop within a half an hour or if you feel faint, call our office at [phone] or go to the emergency room.  6. Problems are not common; however, if there is a substantial amount of bleeding, severe pain, chills, fever or difficulty passing urine (very rare) or other problems, you should call us at [phone] or report to the nearest emergency room.  7. Do not stay seated continuously for more than 2-3 hours for a day or two after the procedure.  Tighten your buttock muscles 10-15 times every two hours and take 10-15 deep breaths every 1-2 hours.  Do not spend more than a few minutes on the toilet if you cannot empty your bowel; instead re-visit the toilet at a later time.      Thank you for entrusting me with your care and for choosing [facility], Dr. [place] Cellar

## 2019-01-27 ENCOUNTER — Ambulatory Visit (INDEPENDENT_AMBULATORY_CARE_PROVIDER_SITE_OTHER): Payer: BLUE CROSS/BLUE SHIELD | Admitting: Physical Medicine and Rehabilitation

## 2019-01-27 ENCOUNTER — Encounter (INDEPENDENT_AMBULATORY_CARE_PROVIDER_SITE_OTHER): Payer: Self-pay | Admitting: Physical Medicine and Rehabilitation

## 2019-01-27 ENCOUNTER — Ambulatory Visit (INDEPENDENT_AMBULATORY_CARE_PROVIDER_SITE_OTHER): Payer: Self-pay

## 2019-01-27 VITALS — BP 146/90 | HR 75 | Temp 98.7°F

## 2019-01-27 DIAGNOSIS — M5416 Radiculopathy, lumbar region: Secondary | ICD-10-CM

## 2019-01-27 MED ORDER — BETAMETHASONE SOD PHOS & ACET 6 (3-3) MG/ML IJ SUSP
12.0000 mg | Freq: Once | INTRAMUSCULAR | Status: AC
  Administered 2019-01-27: 12 mg

## 2019-01-27 NOTE — Patient Instructions (Signed)

## 2019-01-27 NOTE — Progress Notes (Signed)
 .  Numeric Pain Rating Scale and Functional Assessment Average Pain 8   In the last MONTH (on 0-10 scale) has pain interfered with the following?  1. General activity like being  able to carry out your everyday physical activities such as walking, climbing stairs, carrying groceries, or moving a chair?  Rating(4)   +Driver, -BT, -Dye Allergies.  

## 2019-02-02 ENCOUNTER — Ambulatory Visit (INDEPENDENT_AMBULATORY_CARE_PROVIDER_SITE_OTHER): Payer: BLUE CROSS/BLUE SHIELD | Admitting: Orthopedic Surgery

## 2019-02-10 NOTE — Procedures (Signed)
Lumbosacral Transforaminal Epidural Steroid Injection - Sub-Pedicular Approach with Fluoroscopic Guidance  Patient: Malik Ballard      Date of Birth: 1956-02-27 MRN: [id number] PCP: Leonard Downing, MD      Visit Date: 01/27/2019   Universal Protocol:    Date/Time: 01/27/2019  Consent Given By: the patient  Position: PRONE  Additional Comments: Vital signs were monitored before and after the procedure. Patient was prepped and draped in the usual sterile fashion. The correct patient, procedure, and site was verified.   Injection Procedure Details:  Procedure Site One Meds Administered:  Meds ordered this encounter  Medications  . betamethasone acetate-betamethasone sodium phosphate (CELESTONE) injection 12 mg    Laterality: Right  Location/Site:  L4-L5  Needle size: 22 G  Needle type: Spinal  Needle Placement: Transforaminal  Findings:    -Comments: Excellent flow of contrast along the nerve and into the epidural space.  Procedure Details: After squaring off the end-plates to get a true AP view, the C-arm was positioned so that an oblique view of the foramen as noted above was visualized. The target area is just inferior to the "nose of the scotty dog" or sub pedicular. The soft tissues overlying this structure were infiltrated with 2-3 ml. of 1% Lidocaine without Epinephrine.  The spinal needle was inserted toward the target using a "trajectory" view along the fluoroscope beam.  Under AP and lateral visualization, the needle was advanced so it did not puncture dura and was located close the 6 O'Clock position of the pedical in AP tracterory. Biplanar projections were used to confirm position. Aspiration was confirmed to be negative for CSF and/or blood. A 1-2 ml. volume of Isovue-250 was injected and flow of contrast was noted at each level. Radiographs were obtained for documentation purposes.   After attaining the desired flow of contrast documented above, a 0.5 to  1.0 ml test dose of 0.25% Marcaine was injected into each respective transforaminal space.  The patient was observed for 90 seconds post injection.  After no sensory deficits were reported, and normal lower extremity motor function was noted,   the above injectate was administered so that equal amounts of the injectate were placed at each foramen (level) into the transforaminal epidural space.   Additional Comments:  The patient tolerated the procedure well Dressing: Band-Aid    Post-procedure details: Patient was observed during the procedure. Post-procedure instructions were reviewed.  Patient left the clinic in stable condition.

## 2019-02-10 NOTE — Progress Notes (Signed)
Malik Ballard - 63 y.o. male MRN [id number]  Date of birth: 1956-02-06  Office Visit Note: Visit Date: 01/27/2019 PCP: Leonard Downing, MD Referred by: Leonard Downing, *  Subjective: Chief Complaint  Patient presents with  . Lower Back - Pain  . Right Leg - Pain  . Left Leg - Pain  . Left Hip - Pain  . Right Hip - Pain   HPI:  Malik Ballard is a 63 y.o. male who comes in today At the request of Dr. Anderson Malta for right L4 transforaminal epidural steroid injection diagnostically and therapeutically.  Patient has been having ongoing low back pain and bilateral hip pain but much more on the right than the left.  MRI shows right foraminal protrusion.  Patient has failed conservative care and really has not gotten much relief other than pain medication and rest.  ROS Otherwise per HPI.  Assessment & Plan: Visit Diagnoses:  1. Lumbar radiculopathy     Plan: No additional findings.   Meds & Orders:  Meds ordered this encounter  Medications  . betamethasone acetate-betamethasone sodium phosphate (CELESTONE) injection 12 mg    Orders Placed This Encounter  Procedures  . XR C-ARM NO REPORT  . Epidural Steroid injection    Follow-up: Return if symptoms worsen or fail to improve, for Dr. Anderson Malta.   Procedures: No procedures performed  Lumbosacral Transforaminal Epidural Steroid Injection - Sub-Pedicular Approach with Fluoroscopic Guidance  Patient: Malik Ballard      Date of Birth: [birth year] MRN: [id number] PCP: Leonard Downing, MD      Visit Date: 01/27/2019   Universal Protocol:    Date/Time: 01/27/2019  Consent Given By: the patient  Position: PRONE  Additional Comments: Vital signs were monitored before and after the procedure. Patient was prepped and draped in the usual sterile fashion. The correct patient, procedure, and site was verified.   Injection Procedure Details:  Procedure Site One Meds Administered:  Meds ordered this encounter    Medications  . betamethasone acetate-betamethasone sodium phosphate (CELESTONE) injection 12 mg    Laterality: Right  Location/Site:  L4-L5  Needle size: 22 G  Needle type: Spinal  Needle Placement: Transforaminal  Findings:    -Comments: Excellent flow of contrast along the nerve and into the epidural space.  Procedure Details: After squaring off the end-plates to get a true AP view, the C-arm was positioned so that an oblique view of the foramen as noted above was visualized. The target area is just inferior to the "nose of the scotty dog" or sub pedicular. The soft tissues overlying this structure were infiltrated with 2-3 ml. of 1% Lidocaine without Epinephrine.  The spinal needle was inserted toward the target using a "trajectory" view along the fluoroscope beam.  Under AP and lateral visualization, the needle was advanced so it did not puncture dura and was located close the 6 O'Clock position of the pedical in AP tracterory. Biplanar projections were used to confirm position. Aspiration was confirmed to be negative for CSF and/or blood. A 1-2 ml. volume of Isovue-250 was injected and flow of contrast was noted at each level. Radiographs were obtained for documentation purposes.   After attaining the desired flow of contrast documented above, a 0.5 to 1.0 ml test dose of 0.25% Marcaine was injected into each respective transforaminal space.  The patient was observed for 90 seconds post injection.  After no sensory deficits were reported, and normal lower extremity motor function was noted,  the above injectate was administered so that equal amounts of the injectate were placed at each foramen (level) into the transforaminal epidural space.   Additional Comments:  The patient tolerated the procedure well Dressing: Band-Aid    Post-procedure details: Patient was observed during the procedure. Post-procedure instructions were reviewed.  Patient left the clinic in stable  condition.     Clinical History: MRI LUMBAR SPINE WITHOUT CONTRAST  TECHNIQUE: Multiplanar, multisequence MR imaging of the lumbar spine was performed. No intravenous contrast was administered.  COMPARISON:  Lumbar spine radiographs 12/19/2018  FINDINGS: Segmentation:  There are 5 non rib-bearing lumbar type vertebrae.  Alignment:  Trace retrolisthesis of L5 on S1 and of L2 on L3.  Vertebrae: No fracture, suspicious osseous lesion, or significant marrow edema. Scattered small Schmorl's nodes.  Conus medullaris and cauda equina: Conus extends to the L1 level. Conus and cauda equina appear normal.  Paraspinal and other soft tissues: Partially visualized T2 hyperintense lesions in the left kidney, likely cysts but incompletely evaluated.  Disc levels:  Disc desiccation most notable at L2-3 and L5-S1. Mild disc space narrowing at L2-3.  L1-2: Negative.  L2-3: Trace retrolisthesis with minimal disc bulging and mild facet hypertrophy. No stenosis.  L3-4: Mild facet hypertrophy without disc herniation or stenosis.  L4-5: A T2 hyperintense right foraminal disc protrusion, mild endplate spurring, and mild-to-moderate facet hypertrophy result in moderate right neural foraminal stenosis with potential impingement of the exiting right L4 nerve root. No spinal stenosis.  L5-S1: Disc bulging, mild endplate spurring, and mild facet hypertrophy result in mild bilateral neural foraminal stenosis without spinal stenosis.  IMPRESSION: 1. The symptomatic level is favored to be L4-5 where a right foraminal disc protrusion results in moderate right neural foraminal stenosis and potential L4 nerve root impingement. 2. Mild bilateral neural foraminal stenosis at L5-S1. 3. Mild disc degeneration and trace retrolisthesis at L2-3 without stenosis.   Electronically Signed   By: Logan Bores M.D.   On: 12/26/2018 15:03     Objective:  VS:  HT:    WT:   BMI:      BP:(!) 146/90  HR:75bpm  TEMP:98.7 F (37.1 C)(Oral)  RESP:  Physical Exam  Ortho Exam Imaging: No results found.

## 2019-02-18 ENCOUNTER — Encounter: Payer: Self-pay | Admitting: Gastroenterology

## 2019-02-18 ENCOUNTER — Ambulatory Visit (INDEPENDENT_AMBULATORY_CARE_PROVIDER_SITE_OTHER): Payer: BLUE CROSS/BLUE SHIELD | Admitting: Gastroenterology

## 2019-02-18 VITALS — BP 124/92 | HR 80 | Ht 71.25 in | Wt 274.0 lb

## 2019-02-18 DIAGNOSIS — K640 First degree hemorrhoids: Secondary | ICD-10-CM | POA: Diagnosis not present

## 2019-02-18 NOTE — Patient Instructions (Signed)
If you are age 61 or older, your body mass index should be between 23-30. Your Body mass index is 37.95 kg/m. If this is out of the aforementioned range listed, please consider follow up with your Primary Care Provider.  If you are age 15 or younger, your body mass index should be between 19-25. Your Body mass index is 37.95 kg/m. If this is out of the aformentioned range listed, please consider follow up with your Primary Care Provider.   HEMORRHOID BANDING PROCEDURE    FOLLOW-UP CARE   1. The procedure you have had should have been relatively painless since the banding of the area involved does not have nerve endings and there is no pain sensation.  The rubber band cuts off the blood supply to the hemorrhoid and the band may fall off as soon as 48 hours after the banding (the band may occasionally be seen in the toilet bowl following a bowel movement). You may notice a temporary feeling of fullness in the rectum which should respond adequately to plain Tylenol or Motrin.  2. Following the banding, avoid strenuous exercise that evening and resume full activity the next day.  A sitz bath (soaking in a warm tub) or bidet is soothing, and can be useful for cleansing the area after bowel movements.     3. To avoid constipation, take two tablespoons of natural wheat bran, natural oat bran, flax, Benefiber or any over the counter fiber supplement and increase your water intake to 7-8 glasses daily.    4. Unless you have been prescribed anorectal medication, do not put anything inside your rectum for two weeks: No suppositories, enemas, fingers, etc.  5. Occasionally, you may have more bleeding than usual after the banding procedure.  This is often from the untreated hemorrhoids rather than the treated one.  Don't be concerned if there is a tablespoon or so of blood.  If there is more blood than this, lie flat with your bottom higher than your head and apply an ice pack to the area. If the bleeding  does not stop within a half an hour or if you feel faint, call our office at [phone] or go to the emergency room.  6. Problems are not common; however, if there is a substantial amount of bleeding, severe pain, chills, fever or difficulty passing urine (very rare) or other problems, you should call us at [phone] or report to the nearest emergency room.  7. Do not stay seated continuously for more than 2-3 hours for a day or two after the procedure.  Tighten your buttock muscles 10-15 times every two hours and take 10-15 deep breaths every 1-2 hours.  Do not spend more than a few minutes on the toilet if you cannot empty your bowel; instead re-visit the toilet at a later time.    Thank you for entrusting me with your care and for choosing [facility], Dr.  Cellar

## 2019-02-18 NOTE — Progress Notes (Signed)
   63 y/o male here for third banding therapy of hemorrhoids. He has had intermittent bleeding, irritation, and occasional leakage from hemorrhoids. Banded LL hemorrhoid on 01/08/18, and then again on 01/26/18 had RP hemorrhoid treated.  Overall bleeding symptoms have improved but still having some leakage at times. We discussed options. He wished to proceed with therapy and complete the banding series. Colonoscopy as outlined below.  Colonoscopy 11/21/2018 - 3 small adenomas, diverticulosis, internal hemorrhoids   PROCEDURE NOTE: The patient presents with symptomatic grade I  hemorrhoids, requesting rubber band ligation of his/her hemorrhoidal disease.  All risks, benefits and alternative forms of therapy were described and informed consent was obtained.  The anorectum was pre-medicated with 0.125% nitroglycerin The decision was made to band the RA  internal hemorrhoid, and the [place] was used to perform band ligation without complication.  Digital anorectal examination was then performed to assure proper positioning of the band, and to adjust the banded tissue as required.  The patient was discharged home without pain or other issues.  Dietary and behavioral recommendations were given and along with follow-up instructions.     The patient will return as needed moving forward, no further planned banding No complications were encountered and the patient tolerated the procedure well.  [place] Cellar, MD [facility]

## 2021-01-02 ENCOUNTER — Ambulatory Visit: Payer: Self-pay

## 2021-01-02 ENCOUNTER — Ambulatory Visit (INDEPENDENT_AMBULATORY_CARE_PROVIDER_SITE_OTHER): Payer: BC Managed Care – PPO | Admitting: Orthopedic Surgery

## 2021-01-02 ENCOUNTER — Encounter: Payer: Self-pay | Admitting: Orthopedic Surgery

## 2021-01-02 ENCOUNTER — Other Ambulatory Visit: Payer: Self-pay

## 2021-01-02 VITALS — Ht 71.0 in | Wt 294.0 lb

## 2021-01-02 DIAGNOSIS — M1612 Unilateral primary osteoarthritis, left hip: Secondary | ICD-10-CM | POA: Diagnosis not present

## 2021-01-02 DIAGNOSIS — M25552 Pain in left hip: Secondary | ICD-10-CM

## 2021-01-02 DIAGNOSIS — M545 Low back pain, unspecified: Secondary | ICD-10-CM | POA: Diagnosis not present

## 2021-01-02 DIAGNOSIS — G8929 Other chronic pain: Secondary | ICD-10-CM | POA: Diagnosis not present

## 2021-01-08 ENCOUNTER — Encounter: Payer: Self-pay | Admitting: Orthopedic Surgery

## 2021-01-08 NOTE — Progress Notes (Unsigned)
Office Visit Note   Patient: Malik Ballard           Date of Birth: 01-25-1956           MRN: [id number] Visit Date: 01/02/2021 Requested by: Leonard Downing, MD [place],  [place] [address] PCP: Leonard Downing, MD  Subjective: Chief Complaint  Patient presents with  . Lower Back - Pain  . Right Leg - Pain    HPI: Malik Ballard is a 65 y.o. male who presents to the office complaining of left hip pain.  Patient has a history of chronic left hip pain over the last 1 to 2 years.  Localizes most of his pain to the groin of the left hip.  He notes it is particularly hard to get in and out of cars as well as to cross his left foot over his right knee in order to put on his shoes and socks.  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  He notes he recently did a big job at work which caused significantly increased symptoms over the last week.  He has been taking 2 Aleve and 3 Tylenol with some improvement in pain.  Feels that overall symptoms are returning to his baseline.  He is able to put weight on his leg.  In addition to the groin pain he also notes lateral hip pain at times.  He has radiation down from the groin into the anterior lateral knee but no radiation past the knee.  No numbness or tingling.  No history of diabetes..                ROS: All systems reviewed are negative as they relate to the chief complaint within the history of present illness.  Patient denies fevers or chills.  Assessment & Plan: Visit Diagnoses:  1. Unilateral primary osteoarthritis, left hip   2. Pain in left hip   3. Chronic left-sided low back pain, unspecified whether sciatica present     Plan: Patient is a 65 year old male who presents complaining of left hip pain.  He has history of chronic left groin pain for several years but it has been acutely worse in the last week after he had multiple 12 to 14-hour days for up large job at  work.  Seems that his pain is progressively improving with his regimen of Tylenol on Aleve.  Radiographs reviewed and reveal moderate degenerative changes of the left hip joint without fracture or dislocation.  Discussed options available to patient.  Offered to refer patient to Dr. Ernestina Patches for intra-articular left hip injection with cortisone but patient wants to hold off for now.  He will call the office if he decides he wants to proceed with a hip injection.  For now he wants to just continue with over-the-counter medication, given the improvement that he has felt over the last several days.  Follow-up as needed.  Follow-Up Instructions: No follow-ups on file.   Orders:  Orders Placed This Encounter  Procedures  . XR HIP UNILAT W OR W/O PELVIS 2-3 VIEWS LEFT  . XR Lumbar Spine 2-3 Views   No orders of the defined types were placed in this encounter.     Procedures: No procedures performed   Clinical Data: No additional findings.  Objective: Vital Signs: Ht 5\' 11"  (1.803 m)   Wt 294 lb (133.4 kg)   BMI 41.00 kg/m   Physical Exam:  Constitutional: Patient appears well-developed HEENT:  Head: Normocephalic Eyes:EOM are normal Neck: Normal range of motion Cardiovascular: Normal rate Pulmonary/chest: Effort normal Neurologic: Patient is alert Skin: Skin is warm Psychiatric: Patient has normal mood and affect  Ortho Exam: Ortho exam demonstrates pain with left hip range of motion, particularly with internal rotation.  Decreased internal rotation and external rotation of the left hip compared to the contralateral hip.  Positive Stinchfield sign.  Mild tenderness over the left greater trochanter does not present on the right side.  5/5 motor strength of bilateral hip flexion, quadricep, hamstring, dorsiflexion, plantarflexion.  No tenderness throughout the axial lumbar spine.  Specialty Comments:  No specialty comments available.  Imaging: No results found.   PMFS  History: Patient Active Problem List   Diagnosis Date Noted  . RECTAL BLEEDING 07/20/2008   Past Medical History:  Diagnosis Date  . Arthritis   . Cancer ([place])    skin cancer removed basal cell    Family History  Problem Relation Age of Onset  . Colon cancer Neg Hx   . Esophageal cancer Neg Hx   . Prostate cancer Neg Hx   . Rectal cancer Neg Hx     Past Surgical History:  Procedure Laterality Date  . COLONOSCOPY    . POLYPECTOMY    . removed bone spur from right foot    . SHOULDER ARTHROSCOPY WITH ROTATOR CUFF REPAIR AND SUBACROMIAL DECOMPRESSION Right 02/04/2018   Procedure: RIGHT SHOULDER ARTHROSCOPY WITH MINI-OPEN ROTATOR CUFF REPAIR, BICEPS TENODESIS, SUBACROMIAL DECOMPRESSION, CLAVICULAR SPUR REMOVAL;  Surgeon: Meredith Pel, MD;  Location: [facility];  Service: Orthopedics;  Laterality: Right;   Social History   Occupational History  . Not on file  Tobacco Use  . Smoking status: Never Smoker  . Smokeless tobacco: Never Used  Vaping Use  . Vaping Use: Not on file  Substance and Sexual Activity  . Alcohol use: No  . Drug use: No  . Sexual activity: Not on file

## 2021-01-25 ENCOUNTER — Encounter: Payer: Self-pay | Admitting: Orthopedic Surgery

## 2021-01-25 ENCOUNTER — Ambulatory Visit (INDEPENDENT_AMBULATORY_CARE_PROVIDER_SITE_OTHER): Payer: BC Managed Care – PPO | Admitting: Orthopedic Surgery

## 2021-01-25 DIAGNOSIS — G8929 Other chronic pain: Secondary | ICD-10-CM | POA: Diagnosis not present

## 2021-01-25 DIAGNOSIS — M545 Low back pain, unspecified: Secondary | ICD-10-CM | POA: Diagnosis not present

## 2021-01-25 MED ORDER — METHOCARBAMOL 500 MG PO TABS: 500.0000 mg | ORAL_TABLET | Freq: Three times a day (TID) | ORAL | 0 refills | Status: DC | PRN

## 2021-01-25 NOTE — Addendum Note (Signed)
Addended byLaurann Montana on: 01/25/2021 02:31 PM   Modules accepted: Orders

## 2021-01-25 NOTE — Progress Notes (Signed)
Office Visit Note   Patient: Malik Ballard           Date of Birth: [birth year]           MRN: [id number] Visit Date: 01/25/2021 Requested by: Leonard Downing, MD [place],  [place] [address] PCP: Leonard Downing, MD  Subjective: Chief Complaint  Patient presents with  . Other    Follow up hip/back pain    HPI: Malik Ballard is a patient with low back and left leg pain.  We saw him last month and diagnosed him with left hip arthritis.  All of his symptoms started with a high intensity work project the week before Christmas.  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  Developed back and hip pain during that time of hard physical labor.  He has taken some muscle relaxers which have helped some.  Had MRI scan 2019 which showed right-sided L4-5 foraminal disc but his symptoms now are on the left side radiating down the leg into the tibia.  He does have a known history of left hip arthritis which does affect his groin but he is now having a lot of lower back pain centrally as well as pain that radiates into the left leg.  He has done well with steroid treatment for another problem a month ago but he does not want to be on steroids chronically.              ROS: All systems reviewed are negative as they relate to the chief complaint within the history of present illness.  Patient denies  fevers or chills.   Assessment & Plan: Visit Diagnoses: No diagnosis found.  Plan: Impression is low back pain with new left-sided symptoms which could be also contributing to his left-sided symptoms.  He is not having much in the way of right-sided symptoms.  Has episodes where the left leg gives way 1-2 times a week.  This may be hip related or could be back related.  Muscle strength today is pretty reasonable except for hip flexion which is about 5- out of 5 on the left.  Plan is for him to see Dr. Ernestina Patches for lumbar spine ESI.  Also prescribed Robaxin as a muscle  relaxer.  If that is injection in his back does not help then I think further imaging of his back would be indicated.  We could also consider left hip injection at sometime in the future.  Follow-up with Dr. Ernestina Patches for back injection.  Follow-Up Instructions: No follow-ups on file.   Orders:  No orders of the defined types were placed in this encounter.  No orders of the defined types were placed in this encounter.     Procedures: No procedures performed   Clinical Data: No additional findings.  Objective: Vital Signs: There were no vitals taken for this visit.  Physical Exam:   Constitutional: Patient appears well-developed HEENT:  Head: Normocephalic Eyes:EOM are normal Neck: Normal range of motion Cardiovascular: Normal rate Pulmonary/chest: Effort normal Neurologic: Patient is alert Skin: Skin is warm Psychiatric: Patient has normal mood and affect    Ortho Exam: Ortho exam demonstrates full active and passive range of motion of the knees and ankles bilaterally.  Has some pain with internal rotation of the left but not on the right.  Positive nerve root tension signs on the left not on the right.  Reflexes symmetric bilateral patella.  Has good ankle  dorsiflexion plantarflexion quad and hamstring strength but hip flexion strength is about 5-5 the left compared to 5+ out of 5 on the right.  Does have mild pain with forward lateral bending.  No muscle atrophy in the legs on observation.  Specialty Comments:  No specialty comments available.  Imaging: No results found.   PMFS History: Patient Active Problem List   Diagnosis Date Noted  . RECTAL BLEEDING 07/20/2008   Past Medical History:  Diagnosis Date  . Arthritis   . Cancer ([place])    skin cancer removed basal cell    Family History  Problem Relation Age of Onset  . Colon cancer Neg Hx   . Esophageal cancer Neg Hx   . Prostate cancer Neg Hx   . Rectal cancer Neg Hx     Past Surgical History:   Procedure Laterality Date  . COLONOSCOPY    . POLYPECTOMY    . removed bone spur from right foot    . SHOULDER ARTHROSCOPY WITH ROTATOR CUFF REPAIR AND SUBACROMIAL DECOMPRESSION Right 02/04/2018   Procedure: RIGHT SHOULDER ARTHROSCOPY WITH MINI-OPEN ROTATOR CUFF REPAIR, BICEPS TENODESIS, SUBACROMIAL DECOMPRESSION, CLAVICULAR SPUR REMOVAL;  Surgeon: Meredith Pel, MD;  Location: [facility];  Service: Orthopedics;  Laterality: Right;   Social History   Occupational History  . Not on file  Tobacco Use  . Smoking status: Never Smoker  . Smokeless tobacco: Never Used  Vaping Use  . Vaping Use: Not on file  Substance and Sexual Activity  . Alcohol use: No  . Drug use: No  . Sexual activity: Not on file

## 2021-01-31 ENCOUNTER — Other Ambulatory Visit: Payer: Self-pay

## 2021-01-31 ENCOUNTER — Encounter: Payer: Self-pay | Admitting: Physical Medicine and Rehabilitation

## 2021-01-31 ENCOUNTER — Ambulatory Visit (INDEPENDENT_AMBULATORY_CARE_PROVIDER_SITE_OTHER): Payer: BC Managed Care – PPO | Admitting: Physical Medicine and Rehabilitation

## 2021-01-31 ENCOUNTER — Ambulatory Visit: Payer: Self-pay

## 2021-01-31 VITALS — BP 138/92 | HR 83

## 2021-01-31 DIAGNOSIS — M5416 Radiculopathy, lumbar region: Secondary | ICD-10-CM

## 2021-01-31 MED ORDER — METHYLPREDNISOLONE ACETATE 80 MG/ML IJ SUSP
80.0000 mg | Freq: Once | INTRAMUSCULAR | Status: AC
  Administered 2021-01-31: 80 mg

## 2021-01-31 NOTE — Patient Instructions (Signed)

## 2021-01-31 NOTE — Progress Notes (Signed)
Pt state lower back pain that travels down his left leg. Pt state walking, standing and sitting makes the pain worse. Pt state he takes pain meds to help ease his pain.  Numeric Pain Rating Scale and Functional Assessment Average Pain 5   In the last MONTH (on 0-10 scale) has pain interfered with the following?  1. General activity like being  able to carry out your everyday physical activities such as walking, climbing stairs, carrying groceries, or moving a chair?  Rating(10)   +Driver, -BT, -Dye Allergies.

## 2021-02-06 NOTE — Progress Notes (Signed)
Malik Ballard - 65 y.o. male MRN [id number]  Date of birth: November 05, 1956  Office Visit Note: Visit Date: 01/31/2021 PCP: Leonard Downing, MD Referred by: Leonard Downing, *  Subjective: Chief Complaint  Patient presents with  . Lower Back - Pain  . Left Leg - Pain   HPI:  Malik Ballard is a 65 y.o. male who comes in today at the request of Dr. Anderson Malta for planned Left L4-L5 Lumbar epidural steroid injection with fluoroscopic guidance.  The patient has failed conservative care including home exercise, medications, time and activity modification.  This injection will be diagnostic and hopefully therapeutic.  Please see requesting physician notes for further details and justification.  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  Him.  He is now having left-sided symptoms somewhat more of an L4-L5 distribution even somewhat down the back of the leg.  We will try left L4 transforaminal injection.  If none.  If no relief would have Dr. Marlou Sa consider updating MRI of the lumbar spine if needed.  Continue with current conservative care.   ROS Otherwise per HPI.  Assessment & Plan: Visit Diagnoses:    ICD-10-CM   1. Lumbar radiculopathy  M54.16 XR C-ARM NO REPORT    Epidural Steroid injection    methylPREDNISolone acetate (DEPO-MEDROL) injection 80 mg    Plan: No additional findings.   Meds & Orders:  Meds ordered this encounter  Medications  . methylPREDNISolone acetate (DEPO-MEDROL) injection 80 mg    Orders Placed This Encounter  Procedures  . XR C-ARM NO REPORT  . Epidural Steroid injection    Follow-up: Return for visit to requesting physician as needed.   Procedures: No procedures performed  Lumbosacral Transforaminal Epidural Steroid Injection - Sub-Pedicular Approach with Fluoroscopic Guidance  Patient: Malik Ballard      Date of Birth: November 11, 1956 MRN:  [id number] PCP: Leonard Downing, MD      Visit Date: 01/31/2021   Universal Protocol:    Date/Time: 01/31/2021  Consent Given By: the patient  Position: PRONE  Additional Comments: Vital signs were monitored before and after the procedure. Patient was prepped and draped in the usual sterile fashion. The correct patient, procedure, and site was verified.   Injection Procedure Details:   Procedure diagnoses: Lumbar radiculopathy [M54.16]    Meds Administered:  Meds ordered this encounter  Medications  . methylPREDNISolone acetate (DEPO-MEDROL) injection 80 mg    Laterality: Left  Location/Site:  L4-L5  Needle:5.0 in., 22 ga.  Short bevel or Quincke spinal needle  Needle Placement: Transforaminal  Findings:    -Comments: Excellent flow of contrast along the nerve, nerve root and into the epidural space.  Procedure Details: After squaring off the end-plates to get a true AP view, the C-arm was positioned so that an oblique view of the foramen as noted above was visualized. The target area is just inferior to the "nose of the scotty dog" or sub pedicular. The soft tissues overlying this structure were infiltrated with 2-3 ml. of 1% Lidocaine without Epinephrine.  The spinal needle was inserted toward the target using a "trajectory" view along the fluoroscope beam.  Under AP and lateral visualization, the needle was advanced so it did not puncture dura and was located close the 6 O'Clock position of the pedical in AP tracterory. Biplanar projections were used to confirm position. Aspiration was confirmed to be negative for CSF and/or  blood. A 1-2 ml. volume of Isovue-250 was injected and flow of contrast was noted at each level. Radiographs were obtained for documentation purposes.   After attaining the desired flow of contrast documented above, a 0.5 to 1.0 ml test dose of 0.25% Marcaine was injected into each respective transforaminal space.  The patient was observed for  90 seconds post injection.  After no sensory deficits were reported, and normal lower extremity motor function was noted,   the above injectate was administered so that equal amounts of the injectate were placed at each foramen (level) into the transforaminal epidural space.   Additional Comments:  The patient tolerated the procedure well Dressing: 2 x 2 sterile gauze and Band-Aid    Post-procedure details: Patient was observed during the procedure. Post-procedure instructions were reviewed.  Patient left the clinic in stable condition.      Clinical History: MRI LUMBAR SPINE WITHOUT CONTRAST  TECHNIQUE: Multiplanar, multisequence MR imaging of the lumbar spine was performed. No intravenous contrast was administered.  COMPARISON:  Lumbar spine radiographs 12/19/2018  FINDINGS: Segmentation:  There are 5 non rib-bearing lumbar type vertebrae.  Alignment:  Trace retrolisthesis of L5 on S1 and of L2 on L3.  Vertebrae: No fracture, suspicious osseous lesion, or significant marrow edema. Scattered small Schmorl's nodes.  Conus medullaris and cauda equina: Conus extends to the L1 level. Conus and cauda equina appear normal.  Paraspinal and other soft tissues: Partially visualized T2 hyperintense lesions in the left kidney, likely cysts but incompletely evaluated.  Disc levels:  Disc desiccation most notable at L2-3 and L5-S1. Mild disc space narrowing at L2-3.  L1-2: Negative.  L2-3: Trace retrolisthesis with minimal disc bulging and mild facet hypertrophy. No stenosis.  L3-4: Mild facet hypertrophy without disc herniation or stenosis.  L4-5: A T2 hyperintense right foraminal disc protrusion, mild endplate spurring, and mild-to-moderate facet hypertrophy result in moderate right neural foraminal stenosis with potential impingement of the exiting right L4 nerve root. No spinal stenosis.  L5-S1: Disc bulging, mild endplate spurring, and mild  facet hypertrophy result in mild bilateral neural foraminal stenosis without spinal stenosis.  IMPRESSION: 1. The symptomatic level is favored to be L4-5 where a right foraminal disc protrusion results in moderate right neural foraminal stenosis and potential L4 nerve root impingement. 2. Mild bilateral neural foraminal stenosis at L5-S1. 3. Mild disc degeneration and trace retrolisthesis at L2-3 without stenosis.   Electronically Signed   By: Sebastian Ache M.D.   On: 12/26/2018 15:03     Objective:  VS:  HT:    WT:   BMI:     BP:(!) 138/92  HR:83bpm  TEMP: ( )  RESP:  Physical Exam Vitals and nursing note reviewed.  Constitutional:      General: He is not in acute distress.    Appearance: Normal appearance. He is obese. He is not ill-appearing.  HENT:     Head: Normocephalic and atraumatic.     Right Ear: External ear normal.     Left Ear: External ear normal.     Nose: No congestion.  Eyes:     Extraocular Movements: Extraocular movements intact.  Cardiovascular:     Rate and Rhythm: Normal rate.     Pulses: Normal pulses.  Pulmonary:     Effort: Pulmonary effort is normal. No respiratory distress.  Abdominal:     General: There is no distension.     Palpations: Abdomen is soft.  Musculoskeletal:        General:  No tenderness or signs of injury.     Cervical back: Neck supple.     Right lower leg: No edema.     Left lower leg: No edema.     Comments: Patient has good distal strength without clonus.  Positive slump test on the left.  Skin:    Findings: No erythema or rash.  Neurological:     General: No focal deficit present.     Mental Status: He is alert and oriented to person, place, and time.     Sensory: No sensory deficit.     Motor: No weakness or abnormal muscle tone.     Coordination: Coordination normal.  Psychiatric:        Mood and Affect: Mood normal.        Behavior: Behavior normal.      Imaging: No results found.

## 2021-02-06 NOTE — Procedures (Signed)
Lumbosacral Transforaminal Epidural Steroid Injection - Sub-Pedicular Approach with Fluoroscopic Guidance  Patient: Malik Ballard      Date of Birth: 1956/03/11 MRN: [id number] PCP: Leonard Downing, MD      Visit Date: 01/31/2021   Universal Protocol:    Date/Time: 01/31/2021  Consent Given By: the patient  Position: PRONE  Additional Comments: Vital signs were monitored before and after the procedure. Patient was prepped and draped in the usual sterile fashion. The correct patient, procedure, and site was verified.   Injection Procedure Details:   Procedure diagnoses: Lumbar radiculopathy [M54.16]    Meds Administered:  Meds ordered this encounter  Medications  . methylPREDNISolone acetate (DEPO-MEDROL) injection 80 mg    Laterality: Left  Location/Site:  L4-L5  Needle:5.0 in., 22 ga.  Short bevel or Quincke spinal needle  Needle Placement: Transforaminal  Findings:    -Comments: Excellent flow of contrast along the nerve, nerve root and into the epidural space.  Procedure Details: After squaring off the end-plates to get a true AP view, the C-arm was positioned so that an oblique view of the foramen as noted above was visualized. The target area is just inferior to the "nose of the scotty dog" or sub pedicular. The soft tissues overlying this structure were infiltrated with 2-3 ml. of 1% Lidocaine without Epinephrine.  The spinal needle was inserted toward the target using a "trajectory" view along the fluoroscope beam.  Under AP and lateral visualization, the needle was advanced so it did not puncture dura and was located close the 6 O'Clock position of the pedical in AP tracterory. Biplanar projections were used to confirm position. Aspiration was confirmed to be negative for CSF and/or blood. A 1-2 ml. volume of Isovue-250 was injected and flow of contrast was noted at each level. Radiographs were obtained for documentation purposes.   After attaining the  desired flow of contrast documented above, a 0.5 to 1.0 ml test dose of 0.25% Marcaine was injected into each respective transforaminal space.  The patient was observed for 90 seconds post injection.  After no sensory deficits were reported, and normal lower extremity motor function was noted,   the above injectate was administered so that equal amounts of the injectate were placed at each foramen (level) into the transforaminal epidural space.   Additional Comments:  The patient tolerated the procedure well Dressing: 2 x 2 sterile gauze and Band-Aid    Post-procedure details: Patient was observed during the procedure. Post-procedure instructions were reviewed.  Patient left the clinic in stable condition.

## 2021-02-13 ENCOUNTER — Ambulatory Visit: Payer: BC Managed Care – PPO | Admitting: Physical Medicine and Rehabilitation

## 2021-03-03 ENCOUNTER — Telehealth: Payer: Self-pay

## 2021-03-03 DIAGNOSIS — M25552 Pain in left hip: Secondary | ICD-10-CM

## 2021-03-03 NOTE — Telephone Encounter (Signed)
Patient asking for hip injection. Ok to refer to [facility]?

## 2021-03-03 NOTE — Telephone Encounter (Signed)
Yeah okay to try that and followup after injection to gauge response

## 2021-03-06 NOTE — Addendum Note (Signed)
Addended byLaurann [place] on: 03/06/2021 08:35 AM   Modules accepted: Orders

## 2021-03-20 ENCOUNTER — Ambulatory Visit: Payer: Self-pay

## 2021-03-20 ENCOUNTER — Other Ambulatory Visit: Payer: Self-pay

## 2021-03-20 ENCOUNTER — Encounter: Payer: Self-pay | Admitting: Physical Medicine and Rehabilitation

## 2021-03-20 ENCOUNTER — Ambulatory Visit (INDEPENDENT_AMBULATORY_CARE_PROVIDER_SITE_OTHER): Payer: BC Managed Care – PPO | Admitting: Physical Medicine and Rehabilitation

## 2021-03-20 DIAGNOSIS — M25552 Pain in left hip: Secondary | ICD-10-CM | POA: Diagnosis not present

## 2021-03-20 MED ORDER — BUPIVACAINE HCL 0.25 % IJ SOLN
4.0000 mL | INTRAMUSCULAR | Status: AC | PRN
  Administered 2021-03-20: 4 mL via INTRA_ARTICULAR

## 2021-03-20 MED ORDER — TRIAMCINOLONE ACETONIDE 40 MG/ML IJ SUSP
60.0000 mg | INTRAMUSCULAR | Status: AC | PRN
  Administered 2021-03-20: 60 mg via INTRA_ARTICULAR

## 2021-03-20 NOTE — Progress Notes (Signed)
Malik Ballard - 65 y.o. male MRN [id number]  Date of birth: 04/17/1956  Office Visit Note: Visit Date: 03/20/2021 PCP: Leonard Downing, MD Referred by: Leonard Downing, *  Subjective: Chief Complaint  Patient presents with  . Left Hip - Pain   HPI:  Malik Ballard is a 65 y.o. male who comes in today at the request of Dr. Anderson Malta for planned Left anesthetic hip arthrogram with fluoroscopic guidance.  The patient has failed conservative care including home exercise, medications, time and activity modification.  This injection will be diagnostic and hopefully therapeutic.  Please see requesting physician notes for further details and justification.  Prior spine injection did help the more severe throbbing pain down into the calf but he still having this left groin pain worse with going from sit to stand and with standing and mobility.   ROS Otherwise per HPI.  Assessment & Plan: Visit Diagnoses:    ICD-10-CM   1. Pain in left hip  M25.552 Large Joint Inj: L hip joint    XR C-ARM NO REPORT    Plan: No additional findings.   Meds & Orders: No orders of the defined types were placed in this encounter.   Orders Placed This Encounter  Procedures  . Large Joint Inj: L hip joint  . XR C-ARM NO REPORT    Follow-up: Return in about 2 weeks (around 04/03/2021) for Anderson Malta, MD.   Procedures: Large Joint Inj: L hip joint on 03/20/2021 8:59 AM Indications: diagnostic evaluation and pain Details: 22 G 3.5 in needle, fluoroscopy-guided anterior approach  Arthrogram: No  Medications: 4 mL bupivacaine 0.25 %; 60 mg triamcinolone acetonide 40 MG/ML Outcome: tolerated well, no immediate complications  There was excellent flow of contrast producing a partial arthrogram of the hip. The patient did have relief of symptoms during the anesthetic phase of the injection. Procedure, treatment alternatives, risks and benefits explained, specific risks discussed. Consent was given by  the patient. Immediately prior to procedure a time out was called to verify the correct patient, procedure, equipment, support staff and site/side marked as required. Patient was prepped and draped in the usual sterile fashion.          Clinical History: MRI LUMBAR SPINE WITHOUT CONTRAST  TECHNIQUE: Multiplanar, multisequence MR imaging of the lumbar spine was performed. No intravenous contrast was administered.  COMPARISON:  Lumbar spine radiographs 12/19/2018  FINDINGS: Segmentation:  There are 5 non rib-bearing lumbar type vertebrae.  Alignment:  Trace retrolisthesis of L5 on S1 and of L2 on L3.  Vertebrae: No fracture, suspicious osseous lesion, or significant marrow edema. Scattered small Schmorl's nodes.  Conus medullaris and cauda equina: Conus extends to the L1 level. Conus and cauda equina appear normal.  Paraspinal and other soft tissues: Partially visualized T2 hyperintense lesions in the left kidney, likely cysts but incompletely evaluated.  Disc levels:  Disc desiccation most notable at L2-3 and L5-S1. Mild disc space narrowing at L2-3.  L1-2: Negative.  L2-3: Trace retrolisthesis with minimal disc bulging and mild facet hypertrophy. No stenosis.  L3-4: Mild facet hypertrophy without disc herniation or stenosis.  L4-5: A T2 hyperintense right foraminal disc protrusion, mild endplate spurring, and mild-to-moderate facet hypertrophy result in moderate right neural foraminal stenosis with potential impingement of the exiting right L4 nerve root. No spinal stenosis.  L5-S1: Disc bulging, mild endplate spurring, and mild facet hypertrophy result in mild bilateral neural foraminal stenosis without spinal stenosis.  IMPRESSION: 1. The symptomatic  level is favored to be L4-5 where a right foraminal disc protrusion results in moderate right neural foraminal stenosis and potential L4 nerve root impingement. 2. Mild bilateral neural foraminal  stenosis at L5-S1. 3. Mild disc degeneration and trace retrolisthesis at L2-3 without stenosis.   Electronically Signed   By: Logan Bores M.D.   On: 12/26/2018 15:03     Objective:  VS:  HT:    WT:   BMI:     BP:   HR: bpm  TEMP: ( )  RESP:  Physical Exam Vitals and nursing note reviewed.  Constitutional:      General: He is not in acute distress.    Appearance: Normal appearance. He is obese. He is not ill-appearing.  HENT:     Head: Normocephalic and atraumatic.     Right Ear: External ear normal.     Left Ear: External ear normal.     Nose: No congestion.  Eyes:     Extraocular Movements: Extraocular movements intact.  Cardiovascular:     Rate and Rhythm: Normal rate.     Pulses: Normal pulses.  Pulmonary:     Effort: Pulmonary effort is normal. No respiratory distress.  Abdominal:     General: There is no distension.     Palpations: Abdomen is soft.  Musculoskeletal:        General: No tenderness or signs of injury.     Cervical back: Neck supple.     Right lower leg: No edema.     Left lower leg: No edema.     Comments: Patient has good distal strength without clonus.  Concordant pain with left hip rotation.  Decreased range of motion but hip flexion on the left compared to right.  Skin:    Findings: No erythema or rash.  Neurological:     General: No focal deficit present.     Mental Status: He is alert and oriented to person, place, and time.     Sensory: No sensory deficit.     Motor: No weakness or abnormal muscle tone.     Coordination: Coordination normal.  Psychiatric:        Mood and Affect: Mood normal.        Behavior: Behavior normal.      Imaging: No results found.

## 2021-03-20 NOTE — Progress Notes (Signed)
Pt state left hip and groin area pain. Pt state walking, standing and bending makes the pain worse. Pt state he take pain meds to help ease his pain.  Numeric Pain Rating Scale and Functional Assessment Average Pain 2   In the last MONTH (on 0-10 scale) has pain interfered with the following?  1. General activity like being  able to carry out your everyday physical activities such as walking, climbing stairs, carrying groceries, or moving a chair?  Rating(9)

## 2021-04-05 ENCOUNTER — Ambulatory Visit (INDEPENDENT_AMBULATORY_CARE_PROVIDER_SITE_OTHER): Payer: BC Managed Care – PPO | Admitting: Orthopedic Surgery

## 2021-04-05 DIAGNOSIS — M1612 Unilateral primary osteoarthritis, left hip: Secondary | ICD-10-CM | POA: Diagnosis not present

## 2021-04-08 ENCOUNTER — Encounter: Payer: Self-pay | Admitting: Orthopedic Surgery

## 2021-04-08 NOTE — Progress Notes (Signed)
Office Visit Note   Patient: Malik Ballard           Date of Birth: Jul 27, 1956           MRN: [id number] Visit Date: 04/05/2021 Requested by: Leonard Downing, MD [address] [place],  [place] [address] PCP: Leonard Downing, MD  Subjective: Chief Complaint  Patient presents with  . Left Hip - Follow-up  . follow up     HPI: Malik Ballard is a 65 y.o. male who presents to the office s/p left hip injection with Dr. Ernestina Patches on 03/20/2021.  Patient reports that he had excellent relief from the injection with 100% relief that day and 80% relief in the coming days after.  He does report that is starting to wear off.  He has persistent groin pain with occasional radiation down his thigh and occasional buttocks pain.  He did have continual giving out of his leg that was improved by a lumbar spine ESI in early February and he has had no more weakness of his legs since then.  He does note occasional low back pain but nothing consistent.  Reports that flexing slightly at his hips when he stands helps with his groin pain.  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  He reports stepping sideways hurts the worse..                ROS: All systems reviewed are negative as they relate to the chief complaint within the history of present illness.  Patient denies fevers or chills.  Assessment & Plan: Visit Diagnoses:  1. Arthritis of left hip     Plan: Patient is a 65 year old male who presents following left hip injection with Dr. Ernestina Patches.  Injection has provided excellent relief.  However it is now wearing off.  He has history of left hip arthritis.  He takes 2 Tylenols and 2 naproxen's every day for pain control.  Most of his groin pain was improved with 100% relief that day and then 80% relief in the coming days.  He did not have any relief of his back pain with the injection.  Discussed options available to patient including continuing with the relief from  this injection, potentially receiving more injections in the future every 4 months at most versus discussing total hip arthroplasty 2 to 3 months out from his hip injection.  After discussion, patient wishes to continue with the relief from this injection and to follow-up as needed.  He will consider his options in the future and return if his pain gets to the point where he has difficulty functioning.  Follow-Up Instructions: No follow-ups on file.   Orders:  No orders of the defined types were placed in this encounter.  No orders of the defined types were placed in this encounter.     Procedures: No procedures performed   Clinical Data: No additional findings.  Objective: Vital Signs: There were no vitals taken for this visit.  Physical Exam:  Constitutional: Patient appears well-developed HEENT:  Head: Normocephalic Eyes:EOM are normal Neck: Normal range of motion Cardiovascular: Normal rate Pulmonary/chest: Effort normal Neurologic: Patient is alert Skin: Skin is warm Psychiatric: Patient has normal mood and affect  Ortho Exam: Ortho exam demonstrates left hip with groin pain elicited by passive internal rotation.  Positive Stinchfield sign.  Increased pain with passive hip flexion.  No Trendelenburg gait noted.  No significant tenderness over the trochanteric bursa.  5/5 motor strength of hip flexion, quadricep, hamstring, dorsiflexion, plantarflexion.  Specialty Comments:  No specialty comments available.  Imaging: No results found.   PMFS History: Patient Active Problem List   Diagnosis Date Noted  . RECTAL BLEEDING 07/20/2008   Past Medical History:  Diagnosis Date  . Arthritis   . Cancer ([facility])    skin cancer removed basal cell    Family History  Problem Relation Age of Onset  . Colon cancer Neg Hx   . Esophageal cancer Neg Hx   . Prostate cancer Neg Hx   . Rectal cancer Neg Hx     Past Surgical History:  Procedure Laterality Date  . COLONOSCOPY     . POLYPECTOMY    . removed bone spur from right foot    . SHOULDER ARTHROSCOPY WITH ROTATOR CUFF REPAIR AND SUBACROMIAL DECOMPRESSION Right 02/04/2018   Procedure: RIGHT SHOULDER ARTHROSCOPY WITH MINI-OPEN ROTATOR CUFF REPAIR, BICEPS TENODESIS, SUBACROMIAL DECOMPRESSION, CLAVICULAR SPUR REMOVAL;  Surgeon: Meredith Pel, MD;  Location: [facility];  Service: Orthopedics;  Laterality: Right;   Social History   Occupational History  . Not on file  Tobacco Use  . Smoking status: Never Smoker  . Smokeless tobacco: Never Used  Vaping Use  . Vaping Use: Not on file  Substance and Sexual Activity  . Alcohol use: No  . Drug use: No  . Sexual activity: Not on file

## 2021-08-07 ENCOUNTER — Telehealth: Payer: Self-pay

## 2021-08-07 DIAGNOSIS — M545 Low back pain, unspecified: Secondary | ICD-10-CM

## 2021-08-07 NOTE — Telephone Encounter (Signed)
Had right-sided foraminal stenosis in 2019.  If he is having recurrent symptoms on that side MRI scan indicated.  MRI scan not indicated on the hip.  I think it is reasonable to proceed with scanning on his lumbar spine because it looks like he is heading for left hip replacement.  Again MRI left hip not indicated.

## 2021-08-07 NOTE — Telephone Encounter (Signed)
The patient is requesting MRIs of the Lsp and left hip. The pain is getting worse. Certain "wrong moves" continue throw his back out and other "wrong moves" continue to hurt the hip. Does he need to come back in for a follow up before these can be ordered? Please advise.

## 2021-08-08 NOTE — Addendum Note (Signed)
Addended byLaurann Montana on: 08/08/2021 10:29 AM   Modules accepted: Orders

## 2021-08-08 NOTE — Addendum Note (Signed)
Addended by: Melton Alar on: 08/08/2021 10:27 AM   Modules accepted: Orders

## 2021-08-08 NOTE — Telephone Encounter (Signed)
Patient has been contacted and made aware that MRI of Lumbar spine has been ordered.

## 2021-08-19 ENCOUNTER — Ambulatory Visit
Admission: RE | Admit: 2021-08-19 | Discharge: 2021-08-19 | Disposition: A | Discharge status: Home / Self Care | Payer: BC Managed Care – PPO | Source: Ambulatory Visit | Attending: Orthopedic Surgery | Admitting: Orthopedic Surgery

## 2021-08-19 DIAGNOSIS — G8929 Other chronic pain: Secondary | ICD-10-CM

## 2021-08-25 ENCOUNTER — Ambulatory Visit (INDEPENDENT_AMBULATORY_CARE_PROVIDER_SITE_OTHER): Payer: BC Managed Care – PPO | Admitting: Orthopedic Surgery

## 2021-08-25 ENCOUNTER — Other Ambulatory Visit: Payer: Self-pay

## 2021-08-25 ENCOUNTER — Encounter: Payer: Self-pay | Admitting: Orthopedic Surgery

## 2021-08-25 DIAGNOSIS — M79605 Pain in left leg: Secondary | ICD-10-CM | POA: Diagnosis not present

## 2021-08-25 MED ORDER — METHYLPREDNISOLONE 4 MG PO TABS: ORAL_TABLET | ORAL | 0 refills | Status: DC

## 2021-08-25 NOTE — Progress Notes (Signed)
Office Visit Note   Patient: Malik Ballard           Date of Birth: 07/04/1956           MRN: [id number] Visit Date: 08/25/2021 Requested by: Leonard Downing, MD [address] [place],  [place] [address] PCP: Leonard Downing, MD  Subjective: Chief Complaint  Patient presents with   Other     Scan review    HPI: Malik Ballard is a 65 year old patient with low back pain.  Since he was last seen he has had an MRI scan which shows L5-S1 left-sided foraminal stenosis.  Pain has been worse in the last month.  He also has a known history of left hip arthritis.  Reports pain in the groin which radiates anteriorly as well as pain in the back which radiates into the buttocks and down the back of his leg into his foot.  He has been taking some prednisone which he has had which has helped.  Wants to avoid surgery.              ROS: All systems reviewed are negative as they relate to the chief complaint within the history of present illness.  Patient denies  fevers or chills.   Assessment & Plan: Visit Diagnoses:  1. Pain in left leg     Plan: Impression is left-sided foraminal stenosis.  Plan is referral to Dr. Ernestina Patches for L spine ESI in 6-day Medrol Dosepak to help with symptoms temporarily.  The rest of his spine looks pretty reasonable.  Conceivably if the injection does not help then he will need possible decompressive foraminotomies.  Follow-Up Instructions: No follow-ups on file.   Orders:  Orders Placed This Encounter  Procedures   Ambulatory referral to Physical Medicine Rehab   Meds ordered this encounter  Medications   methylPREDNISolone (MEDROL) 4 MG tablet    Sig: Take dosepak as directed    Dispense:  21 tablet    Refill:  0      Procedures: No procedures performed   Clinical Data: No additional findings.  Objective: Vital Signs: There were no vitals taken for this visit.  Physical Exam:   Constitutional: Patient appears well-developed HEENT:  Head:  Normocephalic Eyes:EOM are normal Neck: Normal range of motion Cardiovascular: Normal rate Pulmonary/chest: Effort normal Neurologic: Patient is alert Skin: Skin is warm Psychiatric: Patient has normal mood and affect   Ortho Exam: Ortho exam demonstrates pretty normal gait alignment.  He has some groin pain with internal and external rotation of the left hip.  Pedal pulses palpable.  Mild pain with forward and lateral bending.  No other masses lymphadenopathy or skin changes noted in the hip or back region.  Specialty Comments:  No specialty comments available.  Imaging: No results found.   PMFS History: Patient Active Problem List   Diagnosis Date Noted   RECTAL BLEEDING 07/20/2008   Past Medical History:  Diagnosis Date   Arthritis    Cancer ([facility])    skin cancer removed basal cell    Family History  Problem Relation Age of Onset   Colon cancer Neg Hx    Esophageal cancer Neg Hx    Prostate cancer Neg Hx    Rectal cancer Neg Hx     Past Surgical History:  Procedure Laterality Date   COLONOSCOPY     POLYPECTOMY     removed bone spur from right foot     SHOULDER ARTHROSCOPY WITH ROTATOR CUFF REPAIR AND SUBACROMIAL  DECOMPRESSION Right 02/04/2018   Procedure: RIGHT SHOULDER ARTHROSCOPY WITH MINI-OPEN ROTATOR CUFF REPAIR, BICEPS TENODESIS, SUBACROMIAL DECOMPRESSION, CLAVICULAR SPUR REMOVAL;  Surgeon: Meredith Pel, MD;  Location: [facility];  Service: Orthopedics;  Laterality: Right;   Social History   Occupational History   Not on file  Tobacco Use   Smoking status: Never   Smokeless tobacco: Never  Vaping Use   Vaping Use: Not on file  Substance and Sexual Activity   Alcohol use: No   Drug use: No   Sexual activity: Not on file

## 2021-09-13 ENCOUNTER — Ambulatory Visit (INDEPENDENT_AMBULATORY_CARE_PROVIDER_SITE_OTHER): Payer: BC Managed Care – PPO | Admitting: Physical Medicine and Rehabilitation

## 2021-09-13 ENCOUNTER — Encounter: Payer: Self-pay | Admitting: Physical Medicine and Rehabilitation

## 2021-09-13 ENCOUNTER — Other Ambulatory Visit: Payer: Self-pay

## 2021-09-13 ENCOUNTER — Ambulatory Visit: Payer: Self-pay

## 2021-09-13 VITALS — BP 134/85 | HR 72

## 2021-09-13 DIAGNOSIS — M5416 Radiculopathy, lumbar region: Secondary | ICD-10-CM | POA: Diagnosis not present

## 2021-09-13 MED ORDER — METHYLPREDNISOLONE ACETATE 80 MG/ML IJ SUSP
80.0000 mg | Freq: Once | INTRAMUSCULAR | Status: AC
  Administered 2021-09-13: 80 mg

## 2021-09-13 NOTE — Progress Notes (Signed)
Pt state lower back pain that travels to his buttocks and down his left leg. Pt state walking and standing makes the pain worse. Pt state he takes over the counter pain meds and uses ice to help ease his pain. Pt has hx of inj on 01/31/21 pt state it helped.   Numeric Pain Rating Scale and Functional Assessment Average Pain 2   In the last MONTH (on 0-10 scale) has pain interfered with the following?  1. General activity like being  able to carry out your everyday physical activities such as walking, climbing stairs, carrying groceries, or moving a chair?  Rating(9)   +Driver, -BT, -Dye Allergies.

## 2021-09-13 NOTE — Patient Instructions (Signed)

## 2021-09-17 NOTE — Progress Notes (Signed)
Malik Ballard - 65 y.o. male MRN [id number]  Date of birth: Nov 01, 1956  Office Visit Note: Visit Date: 09/13/2021 PCP: Leonard Downing, MD Referred by: Leonard Downing, *  Subjective: Chief Complaint  Patient presents with   Lower Back - Pain   Left Leg - Pain   HPI:  Malik Ballard is a 65 y.o. male who comes in today at the request of Dr. Anderson [place] for planned Left S1-2 Lumbar Transforaminal epidural steroid injection with fluoroscopic guidance.  The patient has failed conservative care including home exercise, medications, time and activity modification.  This injection will be diagnostic and hopefully therapeutic.  Please see requesting physician notes for further details and justification. MRI reviewed with images and spine model.  MRI reviewed in the note below.  Symptoms are most consistent with S1 radiculitis radiculopathy.  MRI does show foraminal narrowing on the left at L5 with what appears to be more of a swelling of the L5 nerve root but hard to see.  He also has bilateral facet arthropathy at L4-5 which could result in what is called a facet joint syndrome with referral pattern that seems to be more nerve related.  I would start with the S1 transforaminal injection.  He just does not get much better with that would look at an L5 transforaminal injection.  MRI still shows the right-sided foraminal protrusion at L4-5 but no real symptoms on the right after epidural injection sometime ago.     ROS Otherwise per HPI.  Assessment & Plan: Visit Diagnoses:    ICD-10-CM   1. Lumbar radiculopathy  M54.16 XR C-ARM NO REPORT    Epidural Steroid injection    methylPREDNISolone acetate (DEPO-MEDROL) injection 80 mg      Plan: No additional findings.   Meds & Orders:  Meds ordered this encounter  Medications   methylPREDNISolone acetate (DEPO-MEDROL) injection 80 mg    Orders Placed This Encounter  Procedures   XR C-ARM NO REPORT   Epidural Steroid injection     Follow-up: Return if symptoms worsen or fail to improve.   Procedures: No procedures performed  S1 Lumbosacral Transforaminal Epidural Steroid Injection - Sub-Pedicular Approach with Fluoroscopic Guidance   Patient: Malik Ballard      Date of Birth: 28-Oct-1956 MRN: [id number] PCP: Leonard Downing, MD      Visit Date: 09/13/2021   Universal Protocol:    Date/Time: 09/17/2210:58 AM  Consent Given By: the patient  Position:  PRONE  Additional Comments: Vital signs were monitored before and after the procedure. Patient was prepped and draped in the usual sterile fashion. The correct patient, procedure, and site was verified.   Injection Procedure Details:  Procedure Site One Meds Administered:  Meds ordered this encounter  Medications   methylPREDNISolone acetate (DEPO-MEDROL) injection 80 mg    Laterality: Left  Location/Site:  S1 Foramen   Needle size: 22 ga.  Needle type: Spinal  Needle Placement: Transforaminal  Findings:   -Comments: Excellent flow of contrast along the nerve, nerve root and into the epidural space.  Epidurogram: Contrast epidurogram showed no nerve root cut off or restricted flow pattern.  Procedure Details: After squaring off the sacral end-plate to get a true AP view, the C-arm was positioned so that the best possible view of the S1 foramen was visualized. The soft tissues overlying this structure were infiltrated with 2-3 ml. of 1% Lidocaine without Epinephrine.    The spinal needle was inserted toward the target using a "trajectory"  view along the fluoroscope beam.  Under AP and lateral visualization, the needle was advanced so it did not puncture dura. Biplanar projections were used to confirm position. Aspiration was confirmed to be negative for CSF and/or blood. A 1-2 ml. volume of Isovue-250 was injected and flow of contrast was noted at each level. Radiographs were obtained for documentation purposes.   After attaining the desired  flow of contrast documented above, a 0.5 to 1.0 ml test dose of 0.25% Marcaine was injected into each respective transforaminal space.  The patient was observed for 90 seconds post injection.  After no sensory deficits were reported, and normal lower extremity motor function was noted,   the above injectate was administered so that equal amounts of the injectate were placed at each foramen (level) into the transforaminal epidural space.   Additional Comments:  The patient tolerated the procedure well Dressing: Band-Aid with 2 x 2 sterile gauze    Post-procedure details: Patient was observed during the procedure. Post-procedure instructions were reviewed.  Patient left the clinic in stable condition.   Clinical History: MRI LUMBAR SPINE WITHOUT CONTRAST   TECHNIQUE: Multiplanar, multisequence MR imaging of the lumbar spine was performed. No intravenous contrast was administered.   COMPARISON:  Radiography 01/02/2021.  MRI 12/26/2018.   FINDINGS: Segmentation:  5 lumbar type vertebral bodies.   Alignment:  Normal   Vertebrae:  No fracture or focal bone lesion.   Conus medullaris and cauda equina: Conus extends to the L1-2 level. Conus and cauda equina appear normal.   Paraspinal and other soft tissues: Negative   Disc levels:   No abnormality at L1-2 or above.   L2-3: Mild desiccation and bulging of the disc. Mild facet osteoarthritis. No stenosis.   L3-4: Normal interspace.   L4-5: Desiccation of the disc with minimal annular bulging. Small right foraminal to extraforaminal disc protrusion, similar to the previous study. Bilateral facet osteoarthritis with edematous change. No apparent compressive stenosis affecting the canal or lateral recesses. Mild bilateral foraminal stenosis, right more than left. Right L4 nerve could possibly be affected. The facet arthritis could be a cause of back pain or referred facet syndrome pain.   L5-S1: Bulging of the disc more  towards the left. Mild bilateral facet osteoarthritis. Left foraminal stenosis has apparently worsened since 2019, possibly with swelling of the left L5 nerve.   IMPRESSION: At L5-S1, there is worsened foraminal stenosis on the left, apparently with swelling of the left L5 nerve most visible on the axial imaging. This could cause left-sided symptoms. The findings have worsened since 2019. mild facet osteoarthritis at this level which could be painful.   At L4-5, there is bilateral facet osteoarthritis which could be a cause of back pain or referred facet syndrome pain. There is a chronic right lateral foraminal to extraforaminal disc protrusion adjacent to the right L4 nerve which could possibly affect that structure. If anything, this may have involuted minimally since 2019.     Electronically Signed   By: Nelson Chimes M.D.   On: 08/20/2021 18:55     Objective:  VS:  HT:    WT:   BMI:     BP:134/85  HR:72bpm  TEMP: ( )  RESP:  Physical Exam Vitals and nursing note reviewed.  Constitutional:      General: He is not in acute distress.    Appearance: Normal appearance. He is obese. He is not ill-appearing.  HENT:     Head: Normocephalic and atraumatic.  Right Ear: External ear normal.     Left Ear: External ear normal.     Nose: No congestion.  Eyes:     Extraocular Movements: Extraocular movements intact.  Cardiovascular:     Rate and Rhythm: Normal rate.     Pulses: Normal pulses.  Pulmonary:     Effort: Pulmonary effort is normal. No respiratory distress.  Abdominal:     General: There is no distension.     Palpations: Abdomen is soft.  Musculoskeletal:        General: No tenderness or signs of injury.     Cervical back: Neck supple.     Right lower leg: No edema.     Left lower leg: No edema.     Comments: Patient has good distal strength without clonus.  Skin:    Findings: No erythema or rash.  Neurological:     General: No focal deficit present.      Mental Status: He is alert and oriented to person, place, and time.     Sensory: No sensory deficit.     Motor: No weakness or abnormal muscle tone.     Coordination: Coordination normal.  Psychiatric:        Mood and Affect: Mood normal.        Behavior: Behavior normal.     Imaging: No results found.

## 2021-09-17 NOTE — Procedures (Signed)
S1 Lumbosacral Transforaminal Epidural Steroid Injection - Sub-Pedicular Approach with Fluoroscopic Guidance   Patient: Malik Ballard      Date of Birth: 10/16/1956 MRN: [id number] PCP: Leonard Downing, MD      Visit Date: 09/13/2021   Universal Protocol:    Date/Time: 09/17/2210:58 AM  Consent Given By: the patient  Position:  PRONE  Additional Comments: Vital signs were monitored before and after the procedure. Patient was prepped and draped in the usual sterile fashion. The correct patient, procedure, and site was verified.   Injection Procedure Details:  Procedure Site One Meds Administered:  Meds ordered this encounter  Medications   methylPREDNISolone acetate (DEPO-MEDROL) injection 80 mg    Laterality: Left  Location/Site:  S1 Foramen   Needle size: 22 ga.  Needle type: Spinal  Needle Placement: Transforaminal  Findings:   -Comments: Excellent flow of contrast along the nerve, nerve root and into the epidural space.  Epidurogram: Contrast epidurogram showed no nerve root cut off or restricted flow pattern.  Procedure Details: After squaring off the sacral end-plate to get a true AP view, the C-arm was positioned so that the best possible view of the S1 foramen was visualized. The soft tissues overlying this structure were infiltrated with 2-3 ml. of 1% Lidocaine without Epinephrine.    The spinal needle was inserted toward the target using a "trajectory" view along the fluoroscope beam.  Under AP and lateral visualization, the needle was advanced so it did not puncture dura. Biplanar projections were used to confirm position. Aspiration was confirmed to be negative for CSF and/or blood. A 1-2 ml. volume of Isovue-250 was injected and flow of contrast was noted at each level. Radiographs were obtained for documentation purposes.   After attaining the desired flow of contrast documented above, a 0.5 to 1.0 ml test dose of 0.25% Marcaine was injected into each  respective transforaminal space.  The patient was observed for 90 seconds post injection.  After no sensory deficits were reported, and normal lower extremity motor function was noted,   the above injectate was administered so that equal amounts of the injectate were placed at each foramen (level) into the transforaminal epidural space.   Additional Comments:  The patient tolerated the procedure well Dressing: Band-Aid with 2 x 2 sterile gauze    Post-procedure details: Patient was observed during the procedure. Post-procedure instructions were reviewed.  Patient left the clinic in stable condition.

## 2022-01-29 ENCOUNTER — Encounter: Payer: Self-pay | Admitting: Gastroenterology

## 2022-06-01 ENCOUNTER — Encounter: Payer: Self-pay | Admitting: Orthopedic Surgery

## 2022-06-01 ENCOUNTER — Ambulatory Visit (INDEPENDENT_AMBULATORY_CARE_PROVIDER_SITE_OTHER): Payer: Worker's Compensation | Admitting: Orthopedic Surgery

## 2022-06-01 ENCOUNTER — Ambulatory Visit (INDEPENDENT_AMBULATORY_CARE_PROVIDER_SITE_OTHER): Payer: Worker's Compensation

## 2022-06-01 VITALS — BP 144/88 | HR 66

## 2022-06-01 DIAGNOSIS — S66909A Unspecified injury of unspecified muscle, fascia and tendon at wrist and hand level, unspecified hand, initial encounter: Secondary | ICD-10-CM | POA: Diagnosis not present

## 2022-06-01 DIAGNOSIS — M20019 Mallet finger of unspecified finger(s): Secondary | ICD-10-CM | POA: Diagnosis not present

## 2022-06-01 DIAGNOSIS — S6991XA Unspecified injury of right wrist, hand and finger(s), initial encounter: Secondary | ICD-10-CM

## 2022-06-01 NOTE — Progress Notes (Signed)
Office Visit Note   Patient: Malik Ballard           Date of Birth: 1956/04/09           MRN: [id number] Visit Date: 06/01/2022              Requested by: Leonard Downing, MD [address] [place],  [place] [address] PCP: Leonard Downing, MD   Assessment & Plan: Visit Diagnoses:  1. Injury of right little finger, initial encounter   2. Closed injury of tendon of finger excluding thumb with mallet deformity     Plan: We discussed the nature of closed, soft tissue mallet injuries of the finger.  We reviewed his diagnosis, prognosis, both conservative and surgical treatment options.  After discussion, we will plan to treat this nonoperatively in a aluminum splint with the DIP in full extension.  Discussed that we will need to keep the DIP joint fully extended for 6 weeks full-time.  This will be followed by a period of intermittent wear plus full-time wear at night.  I can see him back in a few weeks to see how he is doing with the splint.  Follow-Up Instructions: No follow-ups on file.   Orders:  Orders Placed This Encounter  Procedures   XR Finger Little Right   No orders of the defined types were placed in this encounter.     Procedures: No procedures performed   Clinical Data: No additional findings.   Subjective: Chief Complaint  Patient presents with   Right Little Finger - Injury    DOI: 05/31/22, +n/t, RIGHT handed, was turning a wrench on a machine and felt an electrical shock in finger, he thought he was actually shocked, but when he looked his finger was bent then he thought he dislocated it then he pulled on it to put it back in place, it popped and now he can not straighten it.    This is a 66 year old right-hand-dominant male who presents with a injury to the right small finger at the DIP joint.  He is turning a wrench yesterday when he felt an electrical shock in the finger and thought he was shocked by wire.  He then noticed that he was unable to  extend the small finger at the DIP joint.  It is not painful.  He denies any pain elsewhere in the hand.  Injury   Review of Systems   Objective: Vital Signs: BP (!) 144/88 (BP Location: Left Arm, Patient Position: Sitting)   Pulse 66   Physical Exam Constitutional:      Appearance: Normal appearance.  Cardiovascular:     Rate and Rhythm: Normal rate.     Pulses: Normal pulses.  Pulmonary:     Effort: Pulmonary effort is normal.  Skin:    General: Skin is warm and dry.     Capillary Refill: Capillary refill takes less than 2 seconds.  Neurological:     Mental Status: He is alert.    Right Hand Exam   Tenderness  The patient is experiencing no tenderness.   Other  Erythema: absent Sensation: normal Pulse: present  Comments:  Extensor lag at the small finger DIP joint that is easily reducible.      Specialty Comments:  No specialty comments available.  Imaging: No results found.   PMFS History: Patient Active Problem List   Diagnosis Date Noted   Closed injury of tendon of finger excluding thumb with mallet deformity 06/01/2022   RECTAL  BLEEDING 07/20/2008   Past Medical History:  Diagnosis Date   Arthritis    Cancer ([facility])    skin cancer removed basal cell    Family History  Problem Relation Age of Onset   Colon cancer Neg Hx    Esophageal cancer Neg Hx    Prostate cancer Neg Hx    Rectal cancer Neg Hx     Past Surgical History:  Procedure Laterality Date   COLONOSCOPY     POLYPECTOMY     removed bone spur from right foot     SHOULDER ARTHROSCOPY WITH ROTATOR CUFF REPAIR AND SUBACROMIAL DECOMPRESSION Right 02/04/2018   Procedure: RIGHT SHOULDER ARTHROSCOPY WITH MINI-OPEN ROTATOR CUFF REPAIR, BICEPS TENODESIS, SUBACROMIAL DECOMPRESSION, CLAVICULAR SPUR REMOVAL;  Surgeon: Meredith Pel, MD;  Location: [facility];  Service: Orthopedics;  Laterality: Right;   Social History   Occupational History   Not on file  Tobacco Use   Smoking status:  Never   Smokeless tobacco: Never  Vaping Use   Vaping Use: Not on file  Substance and Sexual Activity   Alcohol use: No   Drug use: No   Sexual activity: Not on file

## 2022-06-06 ENCOUNTER — Telehealth: Payer: Self-pay | Admitting: Orthopedic Surgery

## 2022-06-06 NOTE — Telephone Encounter (Signed)
06/01/22 ov note faxed to [facility] [phone]

## 2022-06-14 ENCOUNTER — Ambulatory Visit (INDEPENDENT_AMBULATORY_CARE_PROVIDER_SITE_OTHER): Payer: Worker's Compensation | Admitting: Orthopedic Surgery

## 2022-06-14 DIAGNOSIS — S6991XA Unspecified injury of right wrist, hand and finger(s), initial encounter: Secondary | ICD-10-CM | POA: Diagnosis not present

## 2022-06-14 DIAGNOSIS — M20019 Mallet finger of unspecified finger(s): Secondary | ICD-10-CM | POA: Diagnosis not present

## 2022-06-14 DIAGNOSIS — S66909A Unspecified injury of unspecified muscle, fascia and tendon at wrist and hand level, unspecified hand, initial encounter: Secondary | ICD-10-CM | POA: Diagnosis not present
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
## 2022-06-15 ENCOUNTER — Ambulatory Visit: Payer: Self-pay | Admitting: Orthopedic Surgery

## 2022-06-21 ENCOUNTER — Ambulatory Visit: Payer: Self-pay | Admitting: Orthopedic Surgery

## 2022-06-21 ENCOUNTER — Telehealth: Payer: Self-pay | Admitting: Radiology

## 2022-06-25 ENCOUNTER — Other Ambulatory Visit: Payer: Self-pay

## 2022-06-25 ENCOUNTER — Encounter: Payer: Self-pay | Admitting: Rehabilitative and Restorative Service Providers"

## 2022-06-25 ENCOUNTER — Ambulatory Visit (INDEPENDENT_AMBULATORY_CARE_PROVIDER_SITE_OTHER): Payer: Worker's Compensation | Admitting: Rehabilitative and Restorative Service Providers"

## 2022-06-25 DIAGNOSIS — M6281 Muscle weakness (generalized): Secondary | ICD-10-CM | POA: Diagnosis not present

## 2022-06-25 DIAGNOSIS — R278 Other lack of coordination: Secondary | ICD-10-CM

## 2022-06-25 DIAGNOSIS — M25641 Stiffness of right hand, not elsewhere classified: Secondary | ICD-10-CM

## 2022-06-25 DIAGNOSIS — R600 Localized edema: Secondary | ICD-10-CM

## 2022-07-12 ENCOUNTER — Ambulatory Visit: Payer: Self-pay | Admitting: Orthopedic Surgery

## 2022-07-24 ENCOUNTER — Ambulatory Visit: Payer: Self-pay | Admitting: Orthopedic Surgery

## 2022-08-05 ENCOUNTER — Emergency Department (HOSPITAL_COMMUNITY): Payer: BC Managed Care – PPO

## 2022-08-05 ENCOUNTER — Other Ambulatory Visit: Payer: Self-pay

## 2022-08-05 ENCOUNTER — Emergency Department (HOSPITAL_COMMUNITY)
Admission: EM | Admit: 2022-08-05 | Discharge: 2022-08-05 | Disposition: A | Discharge status: Home / Self Care | Payer: BC Managed Care – PPO | Attending: Emergency Medicine | Admitting: Emergency Medicine

## 2022-08-05 ENCOUNTER — Encounter (HOSPITAL_COMMUNITY): Payer: Self-pay

## 2022-08-05 DIAGNOSIS — S39012A Strain of muscle, fascia and tendon of lower back, initial encounter: Secondary | ICD-10-CM | POA: Insufficient documentation

## 2022-08-05 DIAGNOSIS — Z85828 Personal history of other malignant neoplasm of skin: Secondary | ICD-10-CM | POA: Diagnosis not present

## 2022-08-05 DIAGNOSIS — M542 Cervicalgia: Secondary | ICD-10-CM | POA: Insufficient documentation

## 2022-08-05 DIAGNOSIS — S50812A Abrasion of left forearm, initial encounter: Secondary | ICD-10-CM | POA: Insufficient documentation

## 2022-08-05 DIAGNOSIS — M25561 Pain in right knee: Secondary | ICD-10-CM | POA: Insufficient documentation

## 2022-08-05 DIAGNOSIS — Y92410 Unspecified street and highway as the place of occurrence of the external cause: Secondary | ICD-10-CM | POA: Diagnosis not present

## 2022-08-05 DIAGNOSIS — S3992XA Unspecified injury of lower back, initial encounter: Secondary | ICD-10-CM | POA: Diagnosis present

## 2022-08-05 DIAGNOSIS — S0990XA Unspecified injury of head, initial encounter: Secondary | ICD-10-CM | POA: Insufficient documentation

## 2022-08-05 MED ORDER — HYDROCODONE-ACETAMINOPHEN 5-325 MG PO TABS
2.0000 | ORAL_TABLET | Freq: Once | ORAL | Status: AC
  Administered 2022-08-05: 2 via ORAL
  Filled 2022-08-05: qty 2

## 2022-08-05 MED ORDER — METHOCARBAMOL 500 MG PO TABS: 500.0000 mg | ORAL_TABLET | Freq: Two times a day (BID) | ORAL | 0 refills | Status: DC

## 2022-08-05 MED ORDER — NAPROXEN 375 MG PO TABS: 375.0000 mg | ORAL_TABLET | Freq: Two times a day (BID) | ORAL | 0 refills | Status: DC

## 2022-08-05 MED ORDER — HYDROCODONE-ACETAMINOPHEN 5-325 MG PO TABS: 2.0000 | ORAL_TABLET | ORAL | 0 refills | Status: DC | PRN
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
## 2022-08-14 ENCOUNTER — Ambulatory Visit (INDEPENDENT_AMBULATORY_CARE_PROVIDER_SITE_OTHER): Payer: BC Managed Care – PPO

## 2022-08-14 ENCOUNTER — Ambulatory Visit (INDEPENDENT_AMBULATORY_CARE_PROVIDER_SITE_OTHER): Payer: BC Managed Care – PPO | Admitting: Orthopedic Surgery

## 2022-08-14 DIAGNOSIS — M25562 Pain in left knee: Secondary | ICD-10-CM

## 2022-08-15 ENCOUNTER — Encounter: Payer: Self-pay | Admitting: Orthopedic Surgery
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
## 2022-08-16 ENCOUNTER — Ambulatory Visit (INDEPENDENT_AMBULATORY_CARE_PROVIDER_SITE_OTHER): Payer: Worker's Compensation | Admitting: Orthopedic Surgery

## 2022-08-16 DIAGNOSIS — M20019 Mallet finger of unspecified finger(s): Secondary | ICD-10-CM

## 2022-08-16 DIAGNOSIS — S66909A Unspecified injury of unspecified muscle, fascia and tendon at wrist and hand level, unspecified hand, initial encounter: Secondary | ICD-10-CM
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
## 2022-09-05 ENCOUNTER — Ambulatory Visit (INDEPENDENT_AMBULATORY_CARE_PROVIDER_SITE_OTHER): Payer: BC Managed Care – PPO

## 2022-09-05 ENCOUNTER — Ambulatory Visit: Payer: BC Managed Care – PPO | Admitting: Orthopedic Surgery

## 2022-09-05 ENCOUNTER — Ambulatory Visit (INDEPENDENT_AMBULATORY_CARE_PROVIDER_SITE_OTHER): Payer: BC Managed Care – PPO | Admitting: Orthopedic Surgery

## 2022-09-05 ENCOUNTER — Encounter: Payer: Self-pay | Admitting: Orthopedic Surgery

## 2022-09-05 DIAGNOSIS — M79671 Pain in right foot: Secondary | ICD-10-CM

## 2022-09-05 DIAGNOSIS — M5416 Radiculopathy, lumbar region: Secondary | ICD-10-CM | POA: Diagnosis not present

## 2022-09-05 DIAGNOSIS — M25562 Pain in left knee: Secondary | ICD-10-CM
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
## 2022-09-05 NOTE — Addendum Note (Signed)
Addended byLaurann Montana on: 09/05/2022 09:23 AM   Modules accepted: Orders

## 2022-09-13 ENCOUNTER — Ambulatory Visit
Admission: RE | Admit: 2022-09-13 | Discharge: 2022-09-13 | Disposition: A | Discharge status: Home / Self Care | Payer: BC Managed Care – PPO | Source: Ambulatory Visit | Attending: Orthopedic Surgery | Admitting: Orthopedic Surgery

## 2022-09-13 DIAGNOSIS — M25562 Pain in left knee: Secondary | ICD-10-CM

## 2022-10-03 ENCOUNTER — Ambulatory Visit (INDEPENDENT_AMBULATORY_CARE_PROVIDER_SITE_OTHER): Payer: BC Managed Care – PPO

## 2022-10-03 ENCOUNTER — Ambulatory Visit (INDEPENDENT_AMBULATORY_CARE_PROVIDER_SITE_OTHER): Payer: BC Managed Care – PPO | Admitting: Orthopedic Surgery

## 2022-10-03 DIAGNOSIS — M25552 Pain in left hip: Secondary | ICD-10-CM | POA: Diagnosis not present

## 2022-10-03 DIAGNOSIS — S32010D Wedge compression fracture of first lumbar vertebra, subsequent encounter for fracture with routine healing: Secondary | ICD-10-CM

## 2022-10-03 DIAGNOSIS — M1712 Unilateral primary osteoarthritis, left knee: Secondary | ICD-10-CM

## 2022-10-03 DIAGNOSIS — M5416 Radiculopathy, lumbar region: Secondary | ICD-10-CM

## 2022-10-05 ENCOUNTER — Encounter: Payer: Self-pay | Admitting: Orthopedic Surgery

## 2022-10-05 MED ORDER — LIDOCAINE HCL 1 % IJ SOLN
5.0000 mL | INTRAMUSCULAR | Status: AC | PRN
  Administered 2022-10-03: 5 mL

## 2022-10-05 MED ORDER — BUPIVACAINE HCL 0.25 % IJ SOLN
4.0000 mL | INTRAMUSCULAR | Status: AC | PRN
  Administered 2022-10-03: 4 mL via INTRA_ARTICULAR

## 2022-10-05 MED ORDER — METHYLPREDNISOLONE ACETATE 40 MG/ML IJ SUSP
40.0000 mg | INTRAMUSCULAR | Status: AC | PRN
  Administered 2022-10-03: 40 mg via INTRA_ARTICULAR
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
## 2022-10-18 ENCOUNTER — Ambulatory Visit (INDEPENDENT_AMBULATORY_CARE_PROVIDER_SITE_OTHER): Payer: BC Managed Care – PPO | Admitting: Orthopaedic Surgery

## 2022-10-18 VITALS — Ht 71.0 in | Wt 282.0 lb

## 2022-10-18 DIAGNOSIS — M1612 Unilateral primary osteoarthritis, left hip: Secondary | ICD-10-CM

## 2022-10-18 DIAGNOSIS — M25552 Pain in left hip: Secondary | ICD-10-CM

## 2022-10-18 NOTE — Progress Notes (Addendum)
Malik Ballard is a 66 year old who is the father-n-law of [name] here in the office.  He is actually sent to me by my partner Dr. Marlou Sa who has been treating his severe end-stage arthritis of his left hip.  At this point his left hip pain is 10 out of 10 and is daily.  He has a difficult time putting his shoes and socks on that left side and he cannot cross his leg.  At this point his left hip pain and arthritis are definitely affecting his mobility, his quality of life and his actives daily living.  He is not on blood thinning medications and is not diabetic.  His BMI is 39.33.  He walks with a significant limp when I examined him as well.  On exam his right hip moves smoothly and fluidly.  His left hip has significant limitations with internal and external rotation and severe pain in the groin with rotation.  Previous x-rays from last year standing show both hips and his pelvis as well as a lateral left hip.  He has severe end-stage arthritis of his left hip with bone-on-bone wear.  There are osteophytes around the hip as well as sclerotic and cystic changes in the rim of the acetabulum and the roof with the main weightbearing aspect of the hip.  There is some flattening of the lateral femoral head as well.  He does have severe end-stage arthritis of his left hip and we are recommending hip replacement surgery.  I had a long and thorough discussion about the heightened risk of the surgery given his weight but the goals being decreased pain, improve mobility and improve quality of life could help him quite a bit.  We talked about what to expect from an intraoperative and postoperative course.  I gave him a handout about hip replacement surgery and went over the hip replacement model with him in detail.  I did review all of his notes in epic from medical standpoint as well.  He would like to have the surgery hopefully scheduled sometime after Thanksgiving.  All questions and concerns were answered and addressed.

## 2022-10-29 ENCOUNTER — Other Ambulatory Visit: Payer: Self-pay

## 2022-11-26 ENCOUNTER — Other Ambulatory Visit: Payer: Self-pay | Admitting: Physician Assistant

## 2022-11-26 DIAGNOSIS — M1612 Unilateral primary osteoarthritis, left hip: Secondary | ICD-10-CM

## 2022-11-28 NOTE — Patient Instructions (Signed)
SURGICAL WAITING ROOM VISITATION Patients having surgery or a procedure may have no more than 2 support people in the waiting area - these visitors may rotate.   Children under the age of 101 must have an adult with them who is not the patient. If the patient needs to stay at the hospital during part of their recovery, the visitor guidelines for inpatient rooms apply. Pre-op nurse will coordinate an appropriate time for 1 support person to accompany patient in pre-op.  This support person may not rotate.    Please refer to the [facility] website for the visitor guidelines for Inpatients (after your surgery is over and you are in a regular room).    Your procedure is scheduled on: 12/07/22   Report to [facility] Main Entrance    Report to admitting at 7:30 AM   Call this number if you have problems the morning of surgery [phone]   Do not eat food :After Midnight.   After Midnight you may have the following liquids until 7:00 AM DAY OF SURGERY  Water Non-Citrus Juices (without pulp, NO RED) Carbonated Beverages Black Coffee (NO MILK/CREAM OR CREAMERS, sugar ok)  Clear Tea (NO MILK/CREAM OR CREAMERS, sugar ok) regular and decaf                             Plain Jell-O (NO RED)                                           Fruit ices (not with fruit pulp, NO RED)                                     Popsicles (NO RED)                                                               Sports drinks like Gatorade (NO RED)               The day of surgery:  Drink ONE (1) Pre-Surgery Clear Ensure at 7:00 AM the morning of surgery. Drink in one sitting. Do not sip.  This drink was given to you during your hospital  pre-op appointment visit. Nothing else to drink after completing the  Pre-Surgery Clear Ensure.          If you have questions, please contact your surgeon's office.   FOLLOW BOWEL PREP AND ANY ADDITIONAL PRE OP INSTRUCTIONS YOU RECEIVED FROM YOUR SURGEON'S OFFICE!!!      Oral Hygiene is also important to reduce your risk of infection.                                    Remember - BRUSH YOUR TEETH THE MORNING OF SURGERY WITH YOUR REGULAR TOOTHPASTE   Take these medicines the morning of surgery with A SIP OF WATER: None  You may not have any metal on your body including jewelry, and body piercing             Do not wear lotions, powders, cologne, or deodorant              Men may shave face and neck.   Do not bring valuables to the hospital. [place].   Bring small overnight bag day of surgery.   DO NOT [place]. PHARMACY WILL DISPENSE MEDICATIONS LISTED ON YOUR MEDICATION LIST TO YOU DURING YOUR ADMISSION [place]!              Please read over the following fact sheets you were given: IF [place] [phone] Schneiders    If you received a COVID test during your pre-op visit  it is requested that you wear a mask when out in public, stay away from anyone that may not be feeling well and notify your surgeon if you develop symptoms. If you test positive for Covid or have been in contact with anyone that has tested positive in the last 10 days please notify you surgeon.    [facility] - Preparing for Surgery Before surgery, you can play an important role.  Because skin is not sterile, your skin needs to be as free of germs as possible.  You can reduce the number of germs on your skin by washing with CHG (chlorahexidine gluconate) soap before surgery.  CHG is an antiseptic cleaner which kills germs and bonds with the skin to continue killing germs even after washing. Please DO NOT use if you have an allergy to CHG or antibacterial soaps.  If your skin becomes reddened/irritated stop using the CHG and inform your nurse when you arrive at Short Stay. Do not shave (including legs and  underarms) for at least 48 hours prior to the first CHG shower.  You may shave your face/neck.  Please follow these instructions carefully:  1.  Shower with CHG Soap the night before surgery and the  morning of surgery.  2.  If you choose to wash your hair, wash your hair first as usual with your normal  shampoo.  3.  After you shampoo, rinse your hair and body thoroughly to remove the shampoo.                             4.  Use CHG as you would any other liquid soap.  You can apply chg directly to the skin and wash.  Gently with a scrungie or clean washcloth.  5.  Apply the CHG Soap to your body ONLY FROM THE NECK DOWN.   Do   not use on face/ open                           Wound or open sores. Avoid contact with eyes, ears mouth and   genitals (private parts).                       Wash face,  Genitals (private parts) with your normal soap.             6.  Wash thoroughly, paying special attention to the area where your  surgery  will be performed.  7.  Thoroughly rinse your body with warm water from the neck down.  8.  DO NOT shower/wash with your normal soap after using and rinsing off the CHG Soap.                9.  Pat yourself dry with a clean towel.            10.  Wear clean pajamas.            11.  Place clean sheets on your bed the night of your first shower and do not  sleep with pets. Day of Surgery : Do not apply any lotions/deodorants the morning of surgery.  Please wear clean clothes to the hospital/surgery center.  FAILURE TO FOLLOW THESE INSTRUCTIONS MAY RESULT IN THE CANCELLATION OF YOUR SURGERY  PATIENT SIGNATURE_________________________________  NURSE SIGNATURE__________________________________  ________________________________________________________________________  Adam Phenix  An incentive spirometer is a tool that can help keep your lungs clear and active. This tool measures how well you are filling your lungs with each breath. Taking long deep  breaths may help reverse or decrease the chance of developing breathing (pulmonary) problems (especially infection) following: A long period of time when you are unable to move or be active. BEFORE THE PROCEDURE  If the spirometer includes an indicator to show your best effort, your nurse or respiratory therapist will set it to a desired goal. If possible, sit up straight or lean slightly forward. Try not to slouch. Hold the incentive spirometer in an upright position. INSTRUCTIONS FOR USE  Sit on the edge of your bed if possible, or sit up as far as you can in bed or on a chair. Hold the incentive spirometer in an upright position. Breathe out normally. Place the mouthpiece in your mouth and seal your lips tightly around it. Breathe in slowly and as deeply as possible, raising the piston or the ball toward the top of the column. Hold your breath for 3-5 seconds or for as long as possible. Allow the piston or ball to fall to the bottom of the column. Remove the mouthpiece from your mouth and breathe out normally. Rest for a few seconds and repeat Steps 1 through 7 at least 10 times every 1-2 hours when you are awake. Take your time and take a few normal breaths between deep breaths. The spirometer may include an indicator to show your best effort. Use the indicator as a goal to work toward during each repetition. After each set of 10 deep breaths, practice coughing to be sure your lungs are clear. If you have an incision (the cut made at the time of surgery), support your incision when coughing by placing a pillow or rolled up towels firmly against it. Once you are able to get out of bed, walk around indoors and cough well. You may stop using the incentive spirometer when instructed by your caregiver.  RISKS AND COMPLICATIONS Take your time so you do not get dizzy or light-headed. If you are in pain, you may need to take or ask for pain medication before doing incentive spirometry. It is harder  to take a deep breath if you are having pain. AFTER USE Rest and breathe slowly and easily. It can be helpful to keep track of a log of your progress. Your caregiver can provide you with a simple table to help with this. If you are using the spirometer at home, follow these instructions: [place] IF:  You are having difficultly using the spirometer. You have trouble using the spirometer as often as instructed. Your pain medication is not giving enough relief while using the spirometer. You develop fever of 100.5 F (38.1 C) or higher. SEEK IMMEDIATE MEDICAL CARE IF:  You cough up bloody sputum that had not been present before. You develop fever of 102 F (38.9 C) or greater. You develop worsening pain at or near the incision site. MAKE SURE YOU:  Understand these instructions. Will watch your condition. Will get help right away if you are not doing well or get worse. Document Released: 04/29/2007 Document Revised: 03/10/2012 Document Reviewed: 06/30/2007 ExitCare Patient Information 2014 [employer], [place].   ________________________________________________________________________ WHAT IS A BLOOD TRANSFUSION? Blood Transfusion Information  A transfusion is the replacement of blood or some of its parts. Blood is made up of multiple cells which provide different functions. Red blood cells carry oxygen and are used for blood loss replacement. White blood cells fight against infection. Platelets control bleeding. Plasma helps clot blood. Other blood products are available for specialized needs, such as hemophilia or other clotting disorders. BEFORE THE TRANSFUSION  Who gives blood for transfusions?  Healthy volunteers who are fully evaluated to make sure their blood is safe. This is blood bank blood. Transfusion therapy is the safest it has ever been in the practice of medicine. Before blood is taken from a donor, a complete history is taken to make sure that person has no  history of diseases nor engages in risky social behavior (examples are intravenous drug use or sexual activity with multiple partners). The donor's travel history is screened to minimize risk of transmitting infections, such as malaria. The donated blood is tested for signs of infectious diseases, such as HIV and hepatitis. The blood is then tested to be sure it is compatible with you in order to minimize the chance of a transfusion reaction. If you or a relative donates blood, this is often done in anticipation of surgery and is not appropriate for emergency situations. It takes many days to process the donated blood. RISKS AND COMPLICATIONS Although transfusion therapy is very safe and saves many lives, the main dangers of transfusion include:  Getting an infectious disease. Developing a transfusion reaction. This is an allergic reaction to something in the blood you were given. Every precaution is taken to prevent this. The decision to have a blood transfusion has been considered carefully by your caregiver before blood is given. Blood is not given unless the benefits outweigh the risks. AFTER THE TRANSFUSION Right after receiving a blood transfusion, you will usually feel much better and more energetic. This is especially true if your red blood cells have gotten low (anemic). The transfusion raises the level of the red blood cells which carry oxygen, and this usually causes an energy increase. The nurse administering the transfusion will monitor you carefully for complications. HOME CARE INSTRUCTIONS  No special instructions are needed after a transfusion. You may find your energy is better. Speak with your caregiver about any limitations on activity for underlying diseases you may have. SEEK MEDICAL CARE IF:  Your condition is not improving after your transfusion. You develop redness or irritation at the intravenous (IV) site. SEEK IMMEDIATE MEDICAL CARE IF:  Any of the following symptoms occur  over the next 12 hours: Shaking chills. You have a temperature by mouth above 102 F (38.9 C), not controlled by medicine. Chest, back, or muscle pain. People around you feel you are not acting  correctly or are confused. Shortness of breath or difficulty breathing. Dizziness and fainting. You get a rash or develop hives. You have a decrease in urine output. Your urine turns a dark color or changes to pink, red, or brown. Any of the following symptoms occur over the next 10 days: You have a temperature by mouth above 102 F (38.9 C), not controlled by medicine. Shortness of breath. Weakness after normal activity. The white part of the eye turns yellow (jaundice). You have a decrease in the amount of urine or are urinating less often. Your urine turns a dark color or changes to pink, red, or brown. Document Released: 12/14/2000 Document Revised: 03/10/2012 Document Reviewed: 08/02/2008 [facility] Patient Information 2014 [place], [place].  _______________________________________________________________________

## 2022-11-28 NOTE — Progress Notes (Signed)
COVID Vaccine Completed:  Date of COVID positive in last 90 days:  PCP - Claris Gower, MD Cardiologist -   Chest x-ray - 08/05/22 Epic EKG -  Stress Test -  ECHO -  Cardiac Cath -  Pacemaker/ICD device last checked: Spinal Cord Stimulator:  Bowel Prep -   Sleep Study -  CPAP -   Fasting Blood Sugar -  Checks Blood Sugar _____ times a day  Last dose of GLP1 agonist-  N/A GLP1 instructions:  N/A   Last dose of SGLT-2 inhibitors-  N/A SGLT-2 instructions: N/A   Blood Thinner Instructions: Aspirin Instructions: Last Dose:  Activity level:  Can go up a flight of stairs and perform activities of daily living without stopping and without symptoms of chest pain or shortness of breath.  Able to exercise without symptoms  Unable to go up a flight of stairs without symptoms of     Anesthesia review:   Patient denies shortness of breath, fever, cough and chest pain at PAT appointment  Patient verbalized understanding of instructions that were given to them at the PAT appointment. Patient was also instructed that they will need to review over the PAT instructions again at home before surgery.

## 2022-11-29 ENCOUNTER — Encounter (HOSPITAL_COMMUNITY)
Admission: RE | Admit: 2022-11-29 | Discharge: 2022-11-29 | Disposition: A | Discharge status: Home / Self Care | Payer: BC Managed Care – PPO | Source: Ambulatory Visit | Attending: Orthopaedic Surgery | Admitting: Orthopaedic Surgery

## 2022-11-29 ENCOUNTER — Encounter (HOSPITAL_COMMUNITY): Payer: Self-pay

## 2022-11-29 VITALS — BP 150/92 | HR 78 | Temp 98.2°F | Resp 16 | Ht 72.0 in | Wt 280.0 lb

## 2022-11-29 DIAGNOSIS — M1612 Unilateral primary osteoarthritis, left hip: Secondary | ICD-10-CM | POA: Diagnosis not present

## 2022-11-29 DIAGNOSIS — Z01812 Encounter for preprocedural laboratory examination: Secondary | ICD-10-CM | POA: Diagnosis not present

## 2022-11-29 DIAGNOSIS — Z01818 Encounter for other preprocedural examination: Secondary | ICD-10-CM

## 2022-11-29 HISTORY — DX: Pneumonia, unspecified organism: J18.9

## 2022-11-29 HISTORY — DX: Personal history of urinary calculi: Z87.442

## 2022-11-29 LAB — COMPREHENSIVE METABOLIC PANEL
ALT: 17 U/L (ref 0–44)
AST: 24 U/L (ref 15–41)
Albumin: 4.3 g/dL (ref 3.5–5.0)
Alkaline Phosphatase: 60 U/L (ref 38–126)
Anion gap: 5 (ref 5–15)
BUN: 19 mg/dL (ref 8–23)
CO2: 27 mmol/L (ref 22–32)
Calcium: 9.3 mg/dL (ref 8.9–10.3)
Chloride: 109 mmol/L (ref 98–111)
Creatinine, Ser: 0.93 mg/dL (ref 0.61–1.24)
GFR, Estimated: >60 mL/min (ref >60)
Glucose, Bld: 105 mg/dL — ABNORMAL HIGH (ref 70–99)
Potassium: 4.2 mmol/L (ref 3.5–5.1)
Sodium: 141 mmol/L (ref 135–145)
Total Bilirubin: 1.7 mg/dL — ABNORMAL HIGH (ref 0.3–1.2)
Total Protein: 7.3 g/dL (ref 6.5–8.1)

## 2022-11-29 LAB — SURGICAL PCR SCREEN
MRSA, PCR: NEGATIVE
Staphylococcus aureus: NEGATIVE

## 2022-11-29 LAB — CBC
HCT: 43.3 % (ref 39.0–52.0)
Hemoglobin: 13.8 g/dL (ref 13.0–17.0)
MCH: 29.5 pg (ref 26.0–34.0)
MCHC: 31.9 g/dL (ref 30.0–36.0)
MCV: 92.5 fL (ref 80.0–100.0)
Platelets: 87 10*3/uL — ABNORMAL LOW (ref 150–400)
RBC: 4.68 MIL/uL (ref 4.22–5.81)
RDW: 14 % (ref 11.5–15.5)
WBC: 4.6 10*3/uL (ref 4.0–10.5)
nRBC: 0 % (ref 0.0–0.2)

## 2022-12-03 ENCOUNTER — Ambulatory Visit: Payer: BC Managed Care – PPO

## 2022-12-03 DIAGNOSIS — M1612 Unilateral primary osteoarthritis, left hip: Secondary | ICD-10-CM

## 2022-12-03 LAB — CBC WITH DIFFERENTIAL/PLATELET
Absolute Monocytes: 1193 cells/uL — ABNORMAL HIGH (ref 200–950)
Basophils Absolute: 21 cells/uL (ref 0–200)
Basophils Relative: 0.4 %
Eosinophils Absolute: 21 cells/uL (ref 15–500)
Eosinophils Relative: 0.4 %
HCT: 45 % (ref 38.5–50.0)
Hemoglobin: 14.9 g/dL (ref 13.2–17.1)
Lymphs Abs: 1717 cells/uL (ref 850–3900)
MCH: 29.9 pg (ref 27.0–33.0)
MCHC: 33.1 g/dL (ref 32.0–36.0)
MCV: 90.2 fL (ref 80.0–100.0)
MPV: 12.4 fL (ref 7.5–12.5)
Monocytes Relative: 22.5 %
Neutro Abs: 2348 cells/uL (ref 1500–7800)
Neutrophils Relative %: 44.3 %
Platelets: 103 10*3/uL — ABNORMAL LOW (ref 140–400)
RBC: 4.99 10*6/uL (ref 4.20–5.80)
RDW: 13.3 % (ref 11.0–15.0)
Total Lymphocyte: 32.4 %
WBC: 5.3 10*3/uL (ref 3.8–10.8)

## 2022-12-03 LAB — EXTRA SPECIMEN

## 2022-12-06 NOTE — H&P (Signed)
TOTAL HIP ADMISSION H&P  Patient is admitted for left total hip arthroplasty.  Subjective:  Chief Complaint: left hip pain  HPI: Malik Ballard, 66 y.o. male, has a history of pain and functional disability in the left hip(s) due to arthritis and patient has failed non-surgical conservative treatments for greater than 12 weeks to include NSAID's and/or analgesics, corticosteriod injections, flexibility and strengthening excercises, weight reduction as appropriate, and activity modification.  Onset of symptoms was gradual starting 3 years ago with gradually worsening course since that time.The patient noted no past surgery on the left hip(s).  Patient currently rates pain in the left hip at 10 out of 10 with activity. Patient has night pain, worsening of pain with activity and weight bearing, trendelenberg gait, pain that interfers with activities of daily living, and pain with passive range of motion. Patient has evidence of subchondral cysts, subchondral sclerosis, periarticular osteophytes, and joint space narrowing by imaging studies. This condition presents safety issues increasing the risk of falls.  There is no current active infection.  Patient Active Problem List   Diagnosis Date Noted   Unilateral primary osteoarthritis, left hip 10/18/2022   Closed injury of tendon of finger excluding thumb with mallet deformity 06/01/2022   RECTAL BLEEDING 07/20/2008   Past Medical History:  Diagnosis Date   Arthritis    Cancer ([place])    skin cancer removed basal cell   History of kidney stones    Pneumonia     Past Surgical History:  Procedure Laterality Date   COLONOSCOPY     POLYPECTOMY     removed bone spur from right foot     SHOULDER ARTHROSCOPY WITH ROTATOR CUFF REPAIR AND SUBACROMIAL DECOMPRESSION Right 02/04/2018   Procedure: RIGHT SHOULDER ARTHROSCOPY WITH MINI-OPEN ROTATOR CUFF REPAIR, BICEPS TENODESIS, SUBACROMIAL DECOMPRESSION, CLAVICULAR SPUR REMOVAL;  Surgeon: Meredith Pel, MD;   Location: [facility];  Service: Orthopedics;  Laterality: Right;    No current facility-administered medications for this encounter.   Current Outpatient Medications  Medication Sig Dispense Refill Last Dose   doxycycline (VIBRA-TABS) 100 MG tablet Take 100 mg by mouth 2 (two) times daily.      naproxen (NAPROSYN) 250 MG tablet Take 500 mg by mouth 2 (two) times daily as needed for mild pain or moderate pain.      promethazine-dextromethorphan (PROMETHAZINE-DM) 6.25-15 MG/5ML syrup Take 10 mLs by mouth 4 (four) times daily as needed for cough.      No Known Allergies  Social History   Tobacco Use   Smoking status: Never   Smokeless tobacco: Never  Substance Use Topics   Alcohol use: No    Family History  Problem Relation Age of Onset   Colon cancer Neg Hx    Esophageal cancer Neg Hx    Prostate cancer Neg Hx    Rectal cancer Neg Hx      Review of Systems  Objective:  Physical Exam Vitals reviewed.  Constitutional:      Appearance: Normal appearance. He is obese.  HENT:     Head: Normocephalic and atraumatic.  Eyes:     Extraocular Movements: Extraocular movements intact.     Pupils: Pupils are equal, round, and reactive to light.  Cardiovascular:     Rate and Rhythm: Normal rate.     Pulses: Normal pulses.  Pulmonary:     Effort: Pulmonary effort is normal.     Breath sounds: Normal breath sounds.  Abdominal:     Palpations: Abdomen is soft.  Musculoskeletal:  Cervical back: Normal range of motion and neck supple.     Left hip: Tenderness and bony tenderness present. Decreased range of motion. Decreased strength.  Neurological:     Mental Status: He is alert and oriented to person, place, and time.  Psychiatric:        Behavior: Behavior normal.     Vital signs in last 24 hours:    Labs:   Estimated body mass index is 37.97 kg/m as calculated from the following:   Height as of 11/29/22: 6' (1.829 m).   Weight as of 11/29/22: 127 kg.   Imaging  Review Plain radiographs demonstrate severe degenerative joint disease of the left hip(s). The bone quality appears to be good for age and reported activity level.      Assessment/Plan:  End stage arthritis, left hip(s)  The patient history, physical examination, clinical judgement of the provider and imaging studies are consistent with end stage degenerative joint disease of the left hip(s) and total hip arthroplasty is deemed medically necessary. The treatment options including medical management, injection therapy, arthroscopy and arthroplasty were discussed at length. The risks and benefits of total hip arthroplasty were presented and reviewed. The risks due to aseptic loosening, infection, stiffness, dislocation/subluxation,  thromboembolic complications and other imponderables were discussed.  The patient acknowledged the explanation, agreed to proceed with the plan and consent was signed. Patient is being admitted for inpatient treatment for surgery, pain control, PT, OT, prophylactic antibiotics, VTE prophylaxis, progressive ambulation and ADL's and discharge planning.The patient is planning to be discharged home with home health services

## 2022-12-07 ENCOUNTER — Other Ambulatory Visit: Payer: Self-pay

## 2022-12-07 ENCOUNTER — Ambulatory Visit (HOSPITAL_COMMUNITY): Payer: BC Managed Care – PPO

## 2022-12-07 ENCOUNTER — Observation Stay (HOSPITAL_COMMUNITY)
Admission: RE | Admit: 2022-12-07 | Discharge: 2022-12-08 | Disposition: A | Discharge status: Home / Self Care | Payer: BC Managed Care – PPO | Attending: Orthopaedic Surgery | Admitting: Orthopaedic Surgery

## 2022-12-07 ENCOUNTER — Ambulatory Visit (HOSPITAL_COMMUNITY): Payer: BC Managed Care – PPO | Admitting: Certified Registered"

## 2022-12-07 ENCOUNTER — Encounter (HOSPITAL_COMMUNITY)
Admission: RE | Disposition: A | Discharge status: Home / Self Care | Payer: Self-pay | Source: Home / Self Care | Attending: Orthopaedic Surgery

## 2022-12-07 ENCOUNTER — Encounter (HOSPITAL_COMMUNITY): Payer: Self-pay | Admitting: Orthopaedic Surgery

## 2022-12-07 ENCOUNTER — Observation Stay (HOSPITAL_COMMUNITY): Payer: BC Managed Care – PPO

## 2022-12-07 DIAGNOSIS — M1612 Unilateral primary osteoarthritis, left hip: Secondary | ICD-10-CM | POA: Diagnosis present

## 2022-12-07 DIAGNOSIS — Z85828 Personal history of other malignant neoplasm of skin: Secondary | ICD-10-CM | POA: Insufficient documentation

## 2022-12-07 DIAGNOSIS — Z96642 Presence of left artificial hip joint: Secondary | ICD-10-CM

## 2022-12-07 HISTORY — PX: TOTAL HIP ARTHROPLASTY: SHX124

## 2022-12-07 LAB — TYPE AND SCREEN
ABO/RH(D): O POS
Antibody Screen: NEGATIVE

## 2022-12-07 LAB — ABO/RH: ABO/RH(D): O POS

## 2022-12-07 SURGERY — ARTHROPLASTY, HIP, TOTAL, ANTERIOR APPROACH
Anesthesia: Monitor Anesthesia Care | Site: Hip | Laterality: Left

## 2022-12-07 MED ORDER — DOCUSATE SODIUM 100 MG PO CAPS
100.0000 mg | ORAL_CAPSULE | Freq: Two times a day (BID) | ORAL | Status: DC
  Administered 2022-12-07 – 2022-12-08 (×2): 100 mg via ORAL
  Filled 2022-12-07 (×2): qty 1

## 2022-12-07 MED ORDER — MIDAZOLAM HCL 2 MG/2ML IJ SOLN
INTRAMUSCULAR | Status: DC | PRN
  Administered 2022-12-07: 2 mg via INTRAVENOUS

## 2022-12-07 MED ORDER — PROPOFOL 500 MG/50ML IV EMUL
INTRAVENOUS | Status: AC
  Filled 2022-12-07: qty 50

## 2022-12-07 MED ORDER — CEFAZOLIN SODIUM-DEXTROSE 1-4 GM/50ML-% IV SOLN
1.0000 g | Freq: Four times a day (QID) | INTRAVENOUS | Status: AC
  Administered 2022-12-07 (×2): 1 g via INTRAVENOUS
  Filled 2022-12-07: qty 50

## 2022-12-07 MED ORDER — PANTOPRAZOLE SODIUM 40 MG PO TBEC
40.0000 mg | DELAYED_RELEASE_TABLET | Freq: Every day | ORAL | Status: DC
  Administered 2022-12-07 – 2022-12-08 (×2): 40 mg via ORAL
  Filled 2022-12-07 (×2): qty 1

## 2022-12-07 MED ORDER — ACETAMINOPHEN 325 MG PO TABS: 325.0000 mg | ORAL_TABLET | Freq: Four times a day (QID) | ORAL | Status: DC | PRN

## 2022-12-07 MED ORDER — SODIUM CHLORIDE 0.9 % IV SOLN: INTRAVENOUS | Status: DC

## 2022-12-07 MED ORDER — BUPIVACAINE IN DEXTROSE 0.75-8.25 % IT SOLN
INTRATHECAL | Status: DC | PRN
  Administered 2022-12-07: 2 mL via INTRATHECAL

## 2022-12-07 MED ORDER — ONDANSETRON HCL 4 MG/2ML IJ SOLN
INTRAMUSCULAR | Status: DC | PRN
  Administered 2022-12-07: 4 mg via INTRAVENOUS

## 2022-12-07 MED ORDER — HYDROMORPHONE HCL 1 MG/ML IJ SOLN: 0.5000 mg | INTRAMUSCULAR | Status: DC | PRN

## 2022-12-07 MED ORDER — OXYCODONE HCL 5 MG/5ML PO SOLN: 5.0000 mg | Freq: Once | ORAL | Status: DC | PRN

## 2022-12-07 MED ORDER — DEXAMETHASONE SODIUM PHOSPHATE 10 MG/ML IJ SOLN
INTRAMUSCULAR | Status: DC | PRN
  Administered 2022-12-07: 8 mg via INTRAVENOUS

## 2022-12-07 MED ORDER — LACTATED RINGERS IV SOLN: INTRAVENOUS | Status: DC

## 2022-12-07 MED ORDER — METHOCARBAMOL 500 MG PO TABS
ORAL_TABLET | ORAL | Status: AC
  Filled 2022-12-07: qty 1

## 2022-12-07 MED ORDER — METHOCARBAMOL 500 MG IVPB - SIMPLE MED: 500.0000 mg | Freq: Four times a day (QID) | INTRAVENOUS | Status: DC | PRN

## 2022-12-07 MED ORDER — MIDAZOLAM HCL 2 MG/2ML IJ SOLN
INTRAMUSCULAR | Status: AC
  Filled 2022-12-07: qty 2

## 2022-12-07 MED ORDER — TRANEXAMIC ACID-NACL 1000-0.7 MG/100ML-% IV SOLN
1000.0000 mg | INTRAVENOUS | Status: AC
  Administered 2022-12-07: 1000 mg via INTRAVENOUS
  Filled 2022-12-07: qty 100

## 2022-12-07 MED ORDER — OXYCODONE HCL 5 MG PO TABS
ORAL_TABLET | ORAL | Status: AC
  Filled 2022-12-07: qty 2

## 2022-12-07 MED ORDER — PROPOFOL 500 MG/50ML IV EMUL
INTRAVENOUS | Status: DC | PRN
  Administered 2022-12-07: 110 ug/kg/min via INTRAVENOUS

## 2022-12-07 MED ORDER — ORAL CARE MOUTH RINSE: 15.0000 mL | OROMUCOSAL | Status: DC | PRN

## 2022-12-07 MED ORDER — DEXAMETHASONE SODIUM PHOSPHATE 10 MG/ML IJ SOLN
INTRAMUSCULAR | Status: AC
  Filled 2022-12-07: qty 1

## 2022-12-07 MED ORDER — PROPOFOL 10 MG/ML IV BOLUS
INTRAVENOUS | Status: DC | PRN
  Administered 2022-12-07 (×2): 20 mg via INTRAVENOUS

## 2022-12-07 MED ORDER — FENTANYL CITRATE PF 50 MCG/ML IJ SOSY: 25.0000 ug | PREFILLED_SYRINGE | INTRAMUSCULAR | Status: DC | PRN

## 2022-12-07 MED ORDER — DIPHENHYDRAMINE HCL 12.5 MG/5ML PO ELIX: 12.5000 mg | ORAL_SOLUTION | ORAL | Status: DC | PRN

## 2022-12-07 MED ORDER — SODIUM CHLORIDE 0.9 % IR SOLN
Status: DC | PRN
  Administered 2022-12-07: 1000 mL

## 2022-12-07 MED ORDER — ONDANSETRON HCL 4 MG/2ML IJ SOLN: 4.0000 mg | Freq: Four times a day (QID) | INTRAMUSCULAR | Status: DC | PRN

## 2022-12-07 MED ORDER — METOCLOPRAMIDE HCL 5 MG PO TABS: 5.0000 mg | ORAL_TABLET | Freq: Three times a day (TID) | ORAL | Status: DC | PRN

## 2022-12-07 MED ORDER — ASPIRIN 81 MG PO CHEW
81.0000 mg | CHEWABLE_TABLET | Freq: Two times a day (BID) | ORAL | Status: DC
  Administered 2022-12-07 – 2022-12-08 (×2): 81 mg via ORAL
  Filled 2022-12-07 (×2): qty 1

## 2022-12-07 MED ORDER — ORAL CARE MOUTH RINSE: 15.0000 mL | Freq: Once | OROMUCOSAL | Status: AC

## 2022-12-07 MED ORDER — ALUM & MAG HYDROXIDE-SIMETH 200-200-20 MG/5ML PO SUSP: 30.0000 mL | ORAL | Status: DC | PRN

## 2022-12-07 MED ORDER — HYDROMORPHONE HCL 2 MG PO TABS
ORAL_TABLET | ORAL | Status: AC
  Filled 2022-12-07: qty 1

## 2022-12-07 MED ORDER — METOCLOPRAMIDE HCL 5 MG/ML IJ SOLN: 5.0000 mg | Freq: Three times a day (TID) | INTRAMUSCULAR | Status: DC | PRN

## 2022-12-07 MED ORDER — PROPOFOL 1000 MG/100ML IV EMUL
INTRAVENOUS | Status: AC
  Filled 2022-12-07: qty 100

## 2022-12-07 MED ORDER — OXYCODONE HCL 5 MG PO TABS: 5.0000 mg | ORAL_TABLET | Freq: Once | ORAL | Status: DC | PRN

## 2022-12-07 MED ORDER — PROPOFOL 10 MG/ML IV BOLUS
INTRAVENOUS | Status: AC
  Filled 2022-12-07: qty 20

## 2022-12-07 MED ORDER — CEFAZOLIN IN SODIUM CHLORIDE 3-0.9 GM/100ML-% IV SOLN
3.0000 g | INTRAVENOUS | Status: AC
  Administered 2022-12-07: 3 g via INTRAVENOUS
  Filled 2022-12-07: qty 100

## 2022-12-07 MED ORDER — MENTHOL 3 MG MT LOZG: 1.0000 | LOZENGE | OROMUCOSAL | Status: DC | PRN

## 2022-12-07 MED ORDER — OXYCODONE HCL 5 MG PO TABS
5.0000 mg | ORAL_TABLET | ORAL | Status: DC | PRN
  Administered 2022-12-07 – 2022-12-08 (×5): 10 mg via ORAL
  Filled 2022-12-07 (×6): qty 2

## 2022-12-07 MED ORDER — CHLORHEXIDINE GLUCONATE 0.12 % MT SOLN
15.0000 mL | Freq: Once | OROMUCOSAL | Status: AC
  Administered 2022-12-07: 15 mL via OROMUCOSAL

## 2022-12-07 MED ORDER — PHENOL 1.4 % MT LIQD: 1.0000 | OROMUCOSAL | Status: DC | PRN

## 2022-12-07 MED ORDER — STERILE WATER FOR IRRIGATION IR SOLN
Status: DC | PRN
  Administered 2022-12-07: 2000 mL

## 2022-12-07 MED ORDER — METHOCARBAMOL 500 MG PO TABS
500.0000 mg | ORAL_TABLET | Freq: Four times a day (QID) | ORAL | Status: DC | PRN
  Administered 2022-12-07 – 2022-12-08 (×4): 500 mg via ORAL
  Filled 2022-12-07 (×3): qty 1

## 2022-12-07 MED ORDER — 0.9 % SODIUM CHLORIDE (POUR BTL) OPTIME
TOPICAL | Status: DC | PRN
  Administered 2022-12-07: 1000 mL

## 2022-12-07 MED ORDER — ONDANSETRON HCL 4 MG PO TABS: 4.0000 mg | ORAL_TABLET | Freq: Four times a day (QID) | ORAL | Status: DC | PRN

## 2022-12-07 MED ORDER — CEFAZOLIN SODIUM-DEXTROSE 1-4 GM/50ML-% IV SOLN
INTRAVENOUS | Status: AC
  Filled 2022-12-07: qty 50

## 2022-12-07 MED ORDER — ONDANSETRON HCL 4 MG/2ML IJ SOLN
INTRAMUSCULAR | Status: AC
  Filled 2022-12-07: qty 2

## 2022-12-07 MED ORDER — POVIDONE-IODINE 10 % EX SWAB
2.0000 | Freq: Once | CUTANEOUS | Status: AC
  Administered 2022-12-07: 2 via TOPICAL

## 2022-12-07 MED ORDER — HYDROMORPHONE HCL 2 MG PO TABS
2.0000 mg | ORAL_TABLET | ORAL | Status: DC | PRN
  Administered 2022-12-07 – 2022-12-08 (×5): 2 mg via ORAL
  Filled 2022-12-07 (×4): qty 1

## 2022-12-07 SURGICAL SUPPLY — 42 items
APL SKNCLS STERI-STRIP NONHPOA (GAUZE/BANDAGES/DRESSINGS)
BAG COUNTER SPONGE SURGICOUNT (BAG) ×1 IMPLANT
BAG SPEC THK2 15X12 ZIP CLS (MISCELLANEOUS)
BAG SPNG CNTER NS LX DISP (BAG) ×1
BAG ZIPLOCK 12X15 (MISCELLANEOUS) IMPLANT
BENZOIN TINCTURE PRP APPL 2/3 (GAUZE/BANDAGES/DRESSINGS) IMPLANT
BLADE SAW SGTL 18X1.27X75 (BLADE) ×1 IMPLANT
COVER PERINEAL POST (MISCELLANEOUS) ×1 IMPLANT
COVER SURGICAL LIGHT HANDLE (MISCELLANEOUS) ×1 IMPLANT
CUP ACET PNNCL SECTR W/GRIP 56 (Hips) IMPLANT
DRAPE FOOT SWITCH (DRAPES) ×1 IMPLANT
DRAPE STERI IOBAN 125X83 (DRAPES) ×1 IMPLANT
DRAPE U-SHAPE 47X51 STRL (DRAPES) ×2 IMPLANT
DRSG AQUACEL AG ADV 3.5X10 (GAUZE/BANDAGES/DRESSINGS) ×1 IMPLANT
DURAPREP 26ML APPLICATOR (WOUND CARE) ×1 IMPLANT
ELECT REM PT RETURN 15FT ADLT (MISCELLANEOUS) ×1 IMPLANT
GAUZE XEROFORM 1X8 LF (GAUZE/BANDAGES/DRESSINGS) ×1 IMPLANT
GLOVE BIO SURGEON STRL SZ7.5 (GLOVE) ×1 IMPLANT
GLOVE BIOGEL PI IND STRL 8 (GLOVE) ×2 IMPLANT
GLOVE ECLIPSE 8.0 STRL XLNG CF (GLOVE) ×1 IMPLANT
GOWN STRL REUS W/ TWL XL LVL3 (GOWN DISPOSABLE) ×2 IMPLANT
GOWN STRL REUS W/TWL XL LVL3 (GOWN DISPOSABLE) ×2
HANDPIECE INTERPULSE COAX TIP (DISPOSABLE) ×1
HEAD CERAMIC DELTA 36 PLUS 1.5 (Hips) IMPLANT
HOLDER FOLEY CATH W/STRAP (MISCELLANEOUS) ×1 IMPLANT
KIT TURNOVER KIT A (KITS) IMPLANT
PACK ANTERIOR HIP CUSTOM (KITS) ×1 IMPLANT
PINN SECTOR W/GRIP ACE CUP 56 (Hips) ×1 IMPLANT
PINNACLE ALTRX PLUS 4 N 36X56 (Hips) IMPLANT
SET HNDPC FAN SPRY TIP SCT (DISPOSABLE) ×1 IMPLANT
STAPLER VISISTAT 35W (STAPLE) IMPLANT
STEM FEMORAL SZ5 HIGH ACTIS (Stem) IMPLANT
STRIP CLOSURE SKIN 1/2X4 (GAUZE/BANDAGES/DRESSINGS) IMPLANT
SUT ETHIBOND NAB CT1 #1 30IN (SUTURE) ×1 IMPLANT
SUT ETHILON 2 0 PS N (SUTURE) IMPLANT
SUT MNCRL AB 4-0 PS2 18 (SUTURE) IMPLANT
SUT VIC AB 0 CT1 36 (SUTURE) ×1 IMPLANT
SUT VIC AB 1 CT1 36 (SUTURE) ×1 IMPLANT
SUT VIC AB 2-0 CT1 27 (SUTURE) ×2
SUT VIC AB 2-0 CT1 TAPERPNT 27 (SUTURE) ×2 IMPLANT
TRAY FOLEY MTR SLVR 16FR STAT (SET/KITS/TRAYS/PACK) IMPLANT
YANKAUER SUCT BULB TIP NO VENT (SUCTIONS) ×1 IMPLANT

## 2022-12-07 NOTE — Plan of Care (Signed)
  Problem: Education: Goal: Knowledge of the prescribed therapeutic regimen will improve Outcome: Progressing   Problem: Pain Management: Goal: Pain level will decrease with appropriate interventions Outcome: Progressing   Problem: Safety: Goal: Ability to remain free from injury will improve Outcome: Progressing

## 2022-12-07 NOTE — Interval H&P Note (Signed)
History and Physical Interval Note: The patient understands that he is here today for a left total hip replacement to treat his severe left hip arthritis.  There is been no acute or interval change in medical status.  See recent H&P.  The risks and benefits of surgery of have been explained in detail and informed consent is obtained.  The left operative hip has been marked.  12/07/2022 8:35 AM  Malik Ballard  has presented today for surgery, with the diagnosis of OSTEOARTHRITIS / [place].  The various methods of treatment have been discussed with the patient and family. After consideration of risks, benefits and other options for treatment, the patient has consented to  Procedure(s): LEFT TOTAL HIP ARTHROPLASTY ANTERIOR APPROACH (Left) as a surgical intervention.  The patient's history has been reviewed, patient examined, no change in status, stable for surgery.  I have reviewed the patient's chart and labs.  Questions were answered to the patient's satisfaction.     Mcarthur Rossetti

## 2022-12-07 NOTE — Transfer of Care (Signed)
Immediate Anesthesia Transfer of Care Note  Patient: Malik Ballard  Procedure(s) Performed: LEFT TOTAL HIP ARTHROPLASTY ANTERIOR APPROACH (Left: Hip)  Patient Location: PACU  Anesthesia Type:Spinal  Level of Consciousness: awake, alert , and oriented  Airway & Oxygen Therapy: Patient Spontanous Breathing and Patient connected to face mask oxygen  Post-op Assessment: Report given to RN and Post -op Vital signs reviewed and stable  Post vital signs: Reviewed and stable  Last Vitals:  Vitals Value Taken Time  BP 117/74   Temp    Pulse 66   Resp 12   SpO2 100     Last Pain:  Vitals:   12/07/22 0806  TempSrc:   PainSc: 5          Complications: No notable events documented.

## 2022-12-07 NOTE — Op Note (Signed)
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 Ninfa Linden, MD Assistant: Benita Stabile, PA-C  Anesthesia: Spinal Antibiotics: 2 g IV Ancef Blood loss: 212 cc Complications: None  Indications: The patient is a very active 66 year old gentleman with well-documented debilitating arthritis that is involves his left hip.  This is been getting worse for several years now.  He has tried and failed all forms of conservative treatment including even weight loss.  At this point his left hip pain is daily and it is detrimentally affecting his mobility, his quality of life and his actives daily living to the point he does wish to proceed with a total hip arthroplasty.  We agree with this as well.  We have discussed the risk of acute blood loss anemia, nerve vessel injury, fracture, infection, dislocation, implant failure, leg length differences and wound healing issues.  All of these are elevated given his obesity.  His BMI is above 38.  He understands her goals are hopefully decrease pain, improve mobility and overall proved quality of life.  Procedure description: After informed consent was obtained appropriate left hip was marked the patient was brought to the operating room and set up on the stretcher where spinal anesthesia was obtained.  He was then laid in supine position on the stretcher and a Foley catheter was placed.  Traction boots were placed on both his feet and next he was placed supine on the Hana fracture table with a perineal post in place in both legs and inline skeletal traction devices but no traction applied.  I then assessed his left hip  radiographically and the left hip was prepped with DuraPrep and sterile drapes.  Timeout is called and he was identified as the correct patient the correct left hip.  An incision was then made just inferior and posterior to the ASIS and carried slightly obliquely down the leg.  Dissection was carried down the tensor fascia lata muscle and the tensor fascia was then divided longitudinally to proceed with a rating approach the hip.  Circumflex vessels were identified cauterized and the hip capsule identified opened up in L-type format finding a moderate joint effusion.  Cobra retractors were placed around the medial lateral femoral neck and a femoral neck cut was made with an oscillating saw just proximal to the lesser trochanter.  This was completed with an osteotome.  A corkscrew guide was placed in the femoral head and the femoral head was removed in its entirety and there was a wide area devoid of cartilage.  A bent Hohmann was then placed over the medial acetabular rim and remnants of the acetabular labrum and other debris removed.  Reaming was then initiated from a size 43 reamer and stepwise increments going up to a size 55 reamer with all reamers placed under direct visitation in the last reamer was placed under direct fluoroscopy in order to obtain the depth of reaming, the inclination and the anteversion.  The real DePuy sector [place] acetabular opponent size 58 was then placed under direct visitation and fluoroscopy without difficulty.  A 36+4 polythene liner was placed for that size 58 acetabular component.  Attention was then turned to the femur.  With the left lower extremity externally rotated to 120  degrees, extended and adducted, a Mueller retractor was placed by the calcar region and a long bent Hohmann was placed behind the greater trochanter.  The lateral joint capsule was released and a box cutting osteotome was used to enter the femoral canal.  Broaching was then initiated from a size 0 broach  and stepwise increments going up to a size 5 broach.  With the size 5 broach in place we trialed a standard offset femoral neck and a 36+1.5 hip ball.  The leg was brought over and up and then with traction and internal rotation reduced in the pelvis.  We assessed it radiographically and clinically and felt like we needed more offset.  The hip was then dislocated and all trial components were removed.  We placed the real Actis femoral component size 5 along with set and we placed a 36+1.5 ceramic head ball.  Again this was reduced in the acetabulum and we are pleased with leg length, offset, range of motion and stability assessed both radiographically and clinically.  The soft tissue was then irrigated with normal saline solution.  The joint capsule was closed with interrupted #1 Ethibond suture followed by #1 Vicryl to close the tensor fascia.  0 Vicryl is used to close the deep tissue and 2-0 Vicryl used to close subcutaneous tissue.  The skin was closed with staples.  An Aquacel dressing was applied.  The patient was taken off the Hana table and taken recovery in stable addition with all final counts being correct and no complications noted.  Benita Stabile, PA-C did assist the entire case and his assistance was crucial and medically necessary for helping guide implant placement and soft tissue retraction as well as a layered closure of the wound.

## 2022-12-07 NOTE — Anesthesia Procedure Notes (Signed)
Spinal  Patient location during procedure: OR Start time: 12/07/2022 9:46 AM End time: 12/07/2022 9:51 AM Reason for block: surgical anesthesia Staffing Performed: resident/CRNA  Resident/CRNA: Niel Hummer, CRNA Performed by: Niel Hummer, CRNA Authorized by: Albertha Ghee, MD   Preanesthetic Checklist Completed: patient identified, IV checked, risks and benefits discussed, surgical consent and monitors and equipment checked Spinal Block Patient position: sitting Prep: DuraPrep Patient monitoring: heart rate, continuous pulse ox and blood pressure Approach: midline Location: L3-4 Injection technique: single-shot Needle Needle type: Pencan  Needle gauge: 24 G Needle length: 10 cm Additional Notes Expiration date of kit checked. Clear CSF flow prior to injection. Dr. Marcie Bal present.

## 2022-12-07 NOTE — Evaluation (Signed)
Physical Therapy Evaluation Patient Details Name: Malik Ballard MRN: [id number] DOB: October 16, 1956 Today's Date: 12/07/2022  History of Present Illness  Pt s/p L THR and with hx of R RCR  Clinical Impression  Pt s/p L THR and presents with decreased L LE strength/ROM and post op pain limiting functional mobility.  Pt should progress to dc home with family assist.     Recommendations for follow up therapy are one component of a multi-disciplinary discharge planning process, led by the attending physician.  Recommendations may be updated based on patient status, additional functional criteria and insurance authorization.  Follow Up Recommendations Follow physician's recommendations for discharge plan and follow up therapies      Assistance Recommended at Discharge Intermittent Supervision/Assistance  Patient can return home with the following  A little help with walking and/or transfers;A little help with bathing/dressing/bathroom;Assistance with cooking/housework;Assist for transportation;Help with stairs or ramp for entrance    Equipment Recommendations None recommended by PT  Recommendations for Other Services       Functional Status Assessment Patient has had a recent decline in their functional status and demonstrates the ability to make significant improvements in function in a reasonable and predictable amount of time.     Precautions / Restrictions Precautions Precautions: Fall Restrictions Weight Bearing Restrictions: No Other Position/Activity Restrictions: WBAT      Mobility  Bed Mobility Overal bed mobility: Needs Assistance Bed Mobility: Supine to Sit, Sit to Supine     Supine to sit: Min assist Sit to supine: Min assist   General bed mobility comments: Increased time with use of rails and min assist to manage L LE    Transfers Overall transfer level: Needs assistance Equipment used: Rolling walker (2 wheels) Transfers: Sit to/from Stand Sit to Stand: Min  assist           General transfer comment: cues for LE management and use of UEs to self assist    Ambulation/Gait Ambulation/Gait assistance: Min assist, Min guard Gait Distance (Feet): 84 Feet Assistive device: Rolling walker (2 wheels) Gait Pattern/deviations: Step-to pattern, Decreased step length - right, Decreased step length - left, Shuffle, Trunk flexed Gait velocity: decr     General Gait Details: cues for sequence, posture and position from [employer]            Wheelchair Mobility    Modified Rankin (Stroke Patients Only)       Balance Overall balance assessment: Mild deficits observed, not formally tested                                           Pertinent Vitals/Pain Pain Assessment Pain Assessment: 0-10 Pain Score: 6  Pain Location: L hip Pain Descriptors / Indicators: Aching, Sore Pain Intervention(s): Limited activity within patient's tolerance, Monitored during session, Premedicated before session, Ice applied    Home Living Family/patient expects to be discharged to:: Private residence Living Arrangements: Spouse/significant other;Children Available Help at Discharge: Family;Available 24 hours/day Type of Home: House Home Access: Stairs to enter Entrance Stairs-Rails: Right Entrance Stairs-Number of Steps: 3 Alternate Level Stairs-Number of Steps: flight Home Layout: Able to live on main level with bedroom/bathroom;Multi-level Home Equipment: Rolling Walker (2 wheels);Cane - single point      Prior Function Prior Level of Function : Independent/Modified Independent  Hand Dominance        Extremity/Trunk Assessment   Upper Extremity Assessment Upper Extremity Assessment: Overall WFL for tasks assessed    Lower Extremity Assessment Lower Extremity Assessment: LLE deficits/detail    Cervical / Trunk Assessment Cervical / Trunk Assessment: Normal  Communication   Communication: No  difficulties  Cognition Arousal/Alertness: Awake/alert Behavior During Therapy: WFL for tasks assessed/performed Overall Cognitive Status: Within Functional Limits for tasks assessed                                          General Comments      Exercises Total Joint Exercises Ankle Circles/Pumps: AROM, Both, 15 reps, Supine   Assessment/Plan    PT Assessment Patient needs continued PT services  PT Problem List Decreased strength;Decreased range of motion;Decreased activity tolerance;Decreased balance;Decreased mobility;Decreased knowledge of use of DME;Obesity;Pain       PT Treatment Interventions DME instruction;Gait training;Stair training;Functional mobility training;Therapeutic activities;Therapeutic exercise;Patient/family education    PT Goals (Current goals can be found in the Care Plan section)  Acute Rehab PT Goals Patient Stated Goal: Regain IND PT Goal Formulation: With patient Time For Goal Achievement: 12/14/22 Potential to Achieve Goals: Good    Frequency 7X/week     Co-evaluation               AM-PAC PT "6 Clicks" Mobility  Outcome Measure Help needed turning from your back to your side while in a flat bed without using bedrails?: A Little Help needed moving from lying on your back to sitting on the side of a flat bed without using bedrails?: A Little Help needed moving to and from a bed to a chair (including a wheelchair)?: A Little Help needed standing up from a chair using your arms (e.g., wheelchair or bedside chair)?: A Little Help needed to walk in hospital room?: A Little Help needed climbing 3-5 steps with a railing? : A Lot 6 Click Score: 17    End of Session Equipment Utilized During Treatment: Gait belt Activity Tolerance: Patient tolerated treatment well Patient left: in chair;with call bell/phone within reach;with chair alarm set Nurse Communication: Mobility status PT Visit Diagnosis: Difficulty in walking, not  elsewhere classified (R26.2)    Time: 1725-1800 PT Time Calculation (min) (ACUTE ONLY): 35 min   Charges:   PT Evaluation $PT Eval Low Complexity: 1 Low PT Treatments $Gait Training: 8-22 mins        [place] Pager [phone] Office [phone]   Naoki Migliaccio 12/07/2022, 7:57 PM

## 2022-12-07 NOTE — Anesthesia Procedure Notes (Signed)
Procedure Name: MAC Date/Time: 12/07/2022 9:46 AM  Performed by: Niel Hummer, CRNAPre-anesthesia Checklist: Patient identified, Emergency Drugs available, Suction available and Patient being monitored Oxygen Delivery Method: Simple face mask

## 2022-12-07 NOTE — Anesthesia Preprocedure Evaluation (Signed)
Anesthesia Evaluation  Patient identified by MRN, date of birth, ID band Patient awake    Reviewed: Allergy & Precautions, H&P , NPO status , Patient's Chart, lab work & pertinent test results  Airway Mallampati: II   Neck ROM: full    Dental   Pulmonary neg pulmonary ROS   breath sounds clear to auscultation       Cardiovascular negative cardio ROS  Rhythm:regular Rate:Normal     Neuro/Psych    GI/Hepatic   Endo/Other    Renal/GU stones     Musculoskeletal  (+) Arthritis ,    Abdominal   Peds  Hematology   Anesthesia Other Findings   Reproductive/Obstetrics                             Anesthesia Physical Anesthesia Plan  ASA: 2  Anesthesia Plan: Spinal and MAC   Post-op Pain Management:    Induction: Intravenous  PONV Risk Score and Plan: 1 and Propofol infusion and Treatment may vary due to age or medical condition  Airway Management Planned: Simple Face Mask  Additional Equipment:   Intra-op Plan:   Post-operative Plan:   Informed Consent: I have reviewed the patients History and Physical, chart, labs and discussed the procedure including the risks, benefits and alternatives for the proposed anesthesia with the patient or authorized representative who has indicated his/her understanding and acceptance.     Dental advisory given  Plan Discussed with: CRNA, Anesthesiologist and Surgeon  Anesthesia Plan Comments:        Anesthesia Quick Evaluation

## 2022-12-08 DIAGNOSIS — M1612 Unilateral primary osteoarthritis, left hip: Secondary | ICD-10-CM | POA: Diagnosis not present

## 2022-12-08 LAB — BASIC METABOLIC PANEL
Anion gap: 7 (ref 5–15)
BUN: 13 mg/dL (ref 8–23)
CO2: 26 mmol/L (ref 22–32)
Calcium: 8.6 mg/dL — ABNORMAL LOW (ref 8.9–10.3)
Chloride: 104 mmol/L (ref 98–111)
Creatinine, Ser: 0.9 mg/dL (ref 0.61–1.24)
GFR, Estimated: >60 mL/min (ref >60)
Glucose, Bld: 134 mg/dL — ABNORMAL HIGH (ref 70–99)
Potassium: 4.2 mmol/L (ref 3.5–5.1)
Sodium: 137 mmol/L (ref 135–145)

## 2022-12-08 LAB — CBC
HCT: 35.6 % — ABNORMAL LOW (ref 39.0–52.0)
Hemoglobin: 11.5 g/dL — ABNORMAL LOW (ref 13.0–17.0)
MCH: 29.6 pg (ref 26.0–34.0)
MCHC: 32.3 g/dL (ref 30.0–36.0)
MCV: 91.5 fL (ref 80.0–100.0)
Platelets: 81 10*3/uL — ABNORMAL LOW (ref 150–400)
RBC: 3.89 MIL/uL — ABNORMAL LOW (ref 4.22–5.81)
RDW: 13.9 % (ref 11.5–15.5)
WBC: 15.7 10*3/uL — ABNORMAL HIGH (ref 4.0–10.5)
nRBC: 0 % (ref 0.0–0.2)

## 2022-12-08 MED ORDER — METHOCARBAMOL 500 MG PO TABS: 500.0000 mg | ORAL_TABLET | Freq: Four times a day (QID) | ORAL | 1 refills | Status: DC | PRN

## 2022-12-08 MED ORDER — ASPIRIN 81 MG PO CHEW: 81.0000 mg | CHEWABLE_TABLET | Freq: Two times a day (BID) | ORAL | 0 refills | Status: DC

## 2022-12-08 MED ORDER — OXYCODONE HCL 5 MG PO TABS: 5.0000 mg | ORAL_TABLET | ORAL | 0 refills | Status: DC | PRN

## 2022-12-08 NOTE — Progress Notes (Signed)
Subjective: 1 Day Post-Op Procedure(s) (LRB): LEFT TOTAL HIP ARTHROPLASTY ANTERIOR APPROACH (Left) Patient reports pain as moderate.    Objective: Vital signs in last 24 hours: Temp:  [97.4 F (36.3 C)-98.9 F (37.2 C)] 98.9 F (37.2 C) (12/09 0800) Pulse Rate:  [65-83] 81 (12/09 0800) Resp:  [13-22] 17 (12/09 0800) BP: (112-148)/(58-87) 128/67 (12/09 0800) SpO2:  [94 %-100 %] 100 % (12/09 0800)  Intake/Output from previous day: 12/08 0701 - 12/09 0700 In: 4639.1 [P.O.:1440; I.V.:2899.1; IV Piggyback:300] Out: 2450 [Urine:2250; Blood:200] Intake/Output this shift: Total I/O In: 350 [P.O.:350] Out: -   Recent Labs    12/08/22 0316  HGB 11.5*   Recent Labs    12/08/22 0316  WBC 15.7*  RBC 3.89*  HCT 35.6*  PLT 81*   Recent Labs    12/08/22 0316  NA 137  K 4.2  CL 104  CO2 26  BUN 13  CREATININE 0.90  GLUCOSE 134*  CALCIUM 8.6*   No results for input(s): "LABPT", "INR" in the last 72 hours.  Sensation intact distally Intact pulses distally Dorsiflexion/Plantar flexion intact Incision: scant drainage   Assessment/Plan: 1 Day Post-Op Procedure(s) (LRB): LEFT TOTAL HIP ARTHROPLASTY ANTERIOR APPROACH (Left) Up with therapy Discharge home with home health      Mcarthur Rossetti 12/08/2022, 11:21 AM

## 2022-12-08 NOTE — Discharge Instructions (Signed)

## 2022-12-08 NOTE — Plan of Care (Signed)

## 2022-12-08 NOTE — Progress Notes (Signed)
Provided discharge education/instructions, all questions and concerns addressed. Pt not ina cute distress, discharged home with belongings accompanied by wife.

## 2022-12-08 NOTE — TOC Transition Note (Signed)
Transition of Care [facility]) - CM/SW Discharge Note   Patient Details  Name: Malik Ballard MRN: [id number] Date of Birth: 01/16/1956  Transition of [facility]) CM/SW Contact:  Henrietta Dine, RN Phone Number: 12/08/2022, 11:47 AM   Clinical Narrative:    D/C orders received; HHPT previously arranged w/ [facility]; Scarlett at [facility] notified of pt's d/c today; pt also has orders for RW and 3-n-1; contacted Mardene Celeste at [place] and the DME will be delivered to the pt's room; notified pt and he says he does not want to receive DME b/c he has it at home; attempted to notify Mardene Celeste at [place] that pt does not want to receive DME; LVM for her; no TOC needs.         Patient Goals and CMS Choice        Discharge Placement                       Discharge Plan and Services                                     Social Determinants of Health (SDOH) Interventions     Readmission Risk Interventions     No data to display

## 2022-12-08 NOTE — Discharge Summary (Signed)
Patient ID: Malik Ballard MRN: [id number] DOB/AGE: [birth year] 64 y.o.  Admit date: 12/07/2022 Discharge date: 12/08/2022  Admission Diagnoses:  Principal Problem:   Unilateral primary osteoarthritis, left hip Active Problems:   Status post total replacement of left hip   Discharge Diagnoses:  Same  Past Medical History:  Diagnosis Date   Arthritis    Cancer ([place])    skin cancer removed basal cell   History of kidney stones    Pneumonia     Surgeries: Procedure(s): LEFT TOTAL HIP ARTHROPLASTY ANTERIOR APPROACH on 12/07/2022   Consultants:   Discharged Condition: Improved  Hospital Course: Malik Ballard is an 66 y.o. male who was admitted 12/07/2022 for operative treatment ofUnilateral primary osteoarthritis, left hip. Patient has severe unremitting pain that affects sleep, daily activities, and work/hobbies. After pre-op clearance the patient was taken to the operating room on 12/07/2022 and underwent  Procedure(s): LEFT TOTAL HIP ARTHROPLASTY ANTERIOR APPROACH.    Patient was given perioperative antibiotics:  Anti-infectives (From admission, onward)    Start     Dose/Rate Route Frequency Ordered Stop   12/07/22 1400  ceFAZolin (ANCEF) IVPB 1 g/50 mL premix        1 g 100 mL/hr over 30 Minutes Intravenous Every 6 hours 12/07/22 1350 12/07/22 2129   12/07/22 1359  ceFAZolin (ANCEF) 1-4 GM/50ML-% IVPB       Note to Pharmacy: [place] Area G: cabinet override      12/07/22 1359 12/07/22 1531   12/07/22 0745  ceFAZolin (ANCEF) IVPB 3g/100 mL premix        3 g 200 mL/hr over 30 Minutes Intravenous On call to O.R. 12/07/22 0740 12/07/22 1022        Patient was given sequential compression devices, early ambulation, and chemoprophylaxis to prevent DVT.  Patient benefited maximally from hospital stay and there were no complications.    Recent vital signs: Patient Vitals for the past 24 hrs:  BP Temp Temp src Pulse Resp SpO2  12/08/22 0800 128/67 98.9 F (37.2 C) Oral 81 17  100 %  12/08/22 0500 112/65 98.6 F (37 C) Oral 83 18 --  12/08/22 0113 117/70 98.6 F (37 C) Oral 71 18 96 %  12/07/22 2026 112/73 (!) 97.4 F (36.3 C) Oral 83 17 95 %  12/07/22 1642 124/83 98 F (36.7 C) Oral 80 17 96 %  12/07/22 1545 129/79 -- -- 78 16 95 %  12/07/22 1530 133/78 -- -- 78 15 95 %  12/07/22 1500 129/74 -- -- 79 15 94 %  12/07/22 1445 126/80 -- -- 76 16 96 %  12/07/22 1430 129/80 97.8 F (36.6 C) -- 73 14 95 %  12/07/22 1415 134/76 -- -- 75 15 95 %  12/07/22 1400 131/82 -- -- 78 15 100 %  12/07/22 1345 133/84 -- -- 73 20 97 %  12/07/22 1330 (!) 140/80 -- -- 73 17 96 %  12/07/22 1315 -- -- -- 75 15 97 %  12/07/22 1300 (!) 148/85 -- -- 81 18 99 %  12/07/22 1245 (!) 139/58 -- -- 72 18 100 %  12/07/22 1230 -- -- -- 72 18 98 %  12/07/22 1215 138/85 -- -- 65 17 100 %  12/07/22 1200 137/85 -- -- 72 (!) 22 99 %  12/07/22 1145 132/87 -- -- 65 13 99 %  12/07/22 1130 124/76 -- -- 68 14 100 %  12/07/22 1128 117/74 -- -- 70 13 100 %  12/07/22 1127 117/74 97.6 F (36.4 C) --  66 14 100 %     Recent laboratory studies:  Recent Labs    12/08/22 0316  WBC 15.7*  HGB 11.5*  HCT 35.6*  PLT 81*  NA 137  K 4.2  CL 104  CO2 26  BUN 13  CREATININE 0.90  GLUCOSE 134*  CALCIUM 8.6*     Discharge Medications:   Allergies as of 12/08/2022   No Known Allergies      Medication List     STOP taking these medications    naproxen 250 MG tablet Commonly known as: NAPROSYN       TAKE these medications    aspirin 81 MG chewable tablet Chew 1 tablet (81 mg total) by mouth 2 (two) times daily.   doxycycline 100 MG tablet Commonly known as: VIBRA-TABS Take 100 mg by mouth 2 (two) times daily.   methocarbamol 500 MG tablet Commonly known as: ROBAXIN Take 1 tablet (500 mg total) by mouth every 6 (six) hours as needed for muscle spasms.   oxyCODONE 5 MG immediate release tablet Commonly known as: Oxy IR/ROXICODONE Take 1-2 tablets (5-10 mg total) by mouth  every 4 (four) hours as needed for moderate pain (pain score 4-6).   promethazine-dextromethorphan 6.25-15 MG/5ML syrup Commonly known as: PROMETHAZINE-DM Take 10 mLs by mouth 4 (four) times daily as needed for cough.               Durable Medical Equipment  (From admission, onward)           Start     Ordered   12/07/22 1629  DME 3 n 1  Once        12/07/22 1628   12/07/22 1629  DME Walker rolling  Once       Question Answer Comment  Walker: With 5 Inch Wheels   Patient needs a walker to treat with the following condition Status post total replacement of left hip      12/07/22 1628            Diagnostic Studies: DG Pelvis Portable  Result Date: 12/07/2022 CLINICAL DATA:  Status post total left hip arthroplasty. EXAM: PORTABLE PELVIS 1-2 VIEWS COMPARISON:  Pelvis and left hip radiographs 01/02/2021 FINDINGS: Interval total left hip arthroplasty. No perihardware lucency is seen to indicate hardware failure or loosening. Expected postoperative changes including lateral left hip and thigh subcutaneous air. Minimal superior right femoroacetabular joint space narrowing is similar to prior. No acute fracture or dislocation. A vascular phlebolith fell again overlies the left hemipelvis. IMPRESSION: Interval total left hip arthroplasty without evidence of hardware failure. Electronically Signed   By: Yvonne Kendall M.D.   On: 12/07/2022 12:38   DG HIP UNILAT WITH PELVIS 1V LEFT  Result Date: 12/07/2022 CLINICAL DATA:  Left hip surgery. EXAM: DG HIP (WITH OR WITHOUT PELVIS) 1V*L* COMPARISON:  None Available. FINDINGS: 3 intraoperative views demonstrate left hip arthroplasty, without acute hardware complication. Incompletely imaged right hip with grossly maintained joint space. IMPRESSION: Intraoperative imaging of left hip arthroplasty. Electronically Signed   By: Abigail Miyamoto M.D.   On: 12/07/2022 11:25   DG C-Arm 1-60 Min-No Report  Result Date: 12/07/2022 Fluoroscopy was  utilized by the requesting physician.  No radiographic interpretation.    Disposition: Discharge disposition: 01-Home or Self Care          Follow-up Information     Mcarthur Rossetti, MD Follow up in 2 week(s).   Specialty: Orthopedic Surgery Contact information: [place]  [address] [phone]                  Signed: Mcarthur Rossetti 12/08/2022, 11:21 AM

## 2022-12-08 NOTE — Progress Notes (Signed)
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  Pt hopeful for dc home this date.   Recommendations for follow up therapy are one component of a multi-disciplinary discharge planning process, led by the attending physician.  Recommendations may be updated based on patient status, additional functional criteria and insurance authorization.  Follow Up Recommendations  Follow physician's recommendations for discharge plan and follow up therapies     Assistance Recommended at Discharge Intermittent Supervision/Assistance  Patient can return home with the following A little help with walking and/or transfers;A little help with bathing/dressing/bathroom;Assistance with cooking/housework;Assist for transportation;Help with stairs or ramp for entrance   Equipment Recommendations  None recommended by PT    Recommendations for Other Services       Precautions / Restrictions Precautions Precautions: Fall Restrictions Weight Bearing Restrictions: No LLE Weight Bearing: Weight bearing as tolerated Other Position/Activity Restrictions: WBAT     Mobility  Bed Mobility Overal bed mobility: Needs Assistance Bed Mobility: Supine to Sit     Supine to sit: Min guard     General bed mobility comments: Pt up in recliner at session beginning and in bathroom at session end    Transfers Overall transfer level: Needs assistance Equipment used: Rolling walker (2 wheels) Transfers: Sit to/from Stand Sit to Stand: Min guard, Supervision           General transfer comment: cues for LE management and use of UEs to self assist    Ambulation/Gait Ambulation/Gait assistance: Min guard,  Supervision Gait Distance (Feet): 55 Feet Assistive device: Rolling walker (2 wheels) Gait Pattern/deviations: Step-to pattern, Decreased step length - right, Decreased step length - left, Shuffle, Trunk flexed Gait velocity: decr     General Gait Details: cues for sequence, posture and position from RW; distance ltd by pain   Stairs Stairs: Yes Stairs assistance: Min assist Stair Management: One rail Left, Step to pattern, Forwards, Sideways, With cane Number of Stairs: 4 General stair comments: 2 stairs sideways with single rail and 2 steps fwd with rail and cane; cues for sequence   Wheelchair Mobility    Modified Rankin (Stroke Patients Only)       Balance Overall balance assessment: Mild deficits observed, not formally tested                                          Cognition Arousal/Alertness: Awake/alert Behavior During Therapy: WFL for tasks assessed/performed Overall Cognitive Status: Within Functional Limits for tasks assessed                                          Exercises Total Joint Exercises Ankle Circles/Pumps: AROM, Both, 15 reps, Supine Quad Sets: AROM, Both, 10 reps, Supine Heel Slides: AAROM, Left, 20 reps, Supine Hip ABduction/ADduction: Left, AAROM, 15 reps, Supine    General Comments        Pertinent Vitals/Pain Pain Assessment Pain Assessment: 0-10 Pain Score: 6  Pain Location: L hip Pain Descriptors / Indicators:  Aching, Sore Pain Intervention(s): Limited activity within patient's tolerance, Monitored during session, Premedicated before session    Home Living                          Prior Function            PT Goals (current goals can now be found in the care plan section) Acute Rehab PT Goals Patient Stated Goal: Regain IND PT Goal Formulation: With patient Time For Goal Achievement: 12/14/22 Potential to Achieve Goals: Good Progress towards PT goals: Progressing toward  goals    Frequency    7X/week      PT Plan Current plan remains appropriate    Co-evaluation              AM-PAC PT "6 Clicks" Mobility   Outcome Measure  Help needed turning from your back to your side while in a flat bed without using bedrails?: A Little Help needed moving from lying on your back to sitting on the side of a flat bed without using bedrails?: A Little Help needed moving to and from a bed to a chair (including a wheelchair)?: A Little Help needed standing up from a chair using your arms (e.g., wheelchair or bedside chair)?: A Little Help needed to walk in hospital room?: A Little Help needed climbing 3-5 steps with a railing? : A Little 6 Click Score: 18    End of Session Equipment Utilized During Treatment: Gait belt Activity Tolerance: Patient tolerated treatment well Patient left: Other (comment) (bathroom) Nurse Communication: Mobility status PT Visit Diagnosis: Difficulty in walking, not elsewhere classified (R26.2)     Time: 5638-9373 PT Time Calculation (min) (ACUTE ONLY): 30 min  Charges:  $Gait Training: 8-22 mins $Therapeutic Exercise: 8-22 mins $Therapeutic Activity: 8-22 mins                     [place] Pager [phone] Office [phone]    Malik Ballard 12/08/2022, 1:04 PM

## 2022-12-08 NOTE — Progress Notes (Signed)
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  Pt up to ambulate increased distance in hall and requiring decreased assistance for most tasks.   Recommendations for follow up therapy are one component of a multi-disciplinary discharge planning process, led by the attending physician.  Recommendations may be updated based on patient status, additional functional criteria and insurance authorization.  Follow Up Recommendations  Follow physician's recommendations for discharge plan and follow up therapies     Assistance Recommended at Discharge Intermittent Supervision/Assistance  Patient can return home with the following A little help with walking and/or transfers;A little help with bathing/dressing/bathroom;Assistance with cooking/housework;Assist for transportation;Help with stairs or ramp for entrance   Equipment Recommendations  None recommended by PT    Recommendations for Other Services       Precautions / Restrictions Precautions Precautions: Fall Restrictions Weight Bearing Restrictions: No LLE Weight Bearing: Weight bearing as tolerated Other Position/Activity Restrictions: WBAT     Mobility  Bed Mobility Overal bed mobility: Needs Assistance Bed Mobility: Supine to Sit     Supine to sit: Min guard     General bed mobility comments: Increased time with use of rails and min guard to manage L LE    Transfers Overall transfer level: Needs assistance Equipment used: Rolling walker (2 wheels) Transfers: Sit to/from Stand Sit to Stand: Min guard           General transfer comment: cues for LE management and use of UEs to self assist    Ambulation/Gait Ambulation/Gait assistance: Min guard Gait Distance (Feet):  85 Feet Assistive device: Rolling walker (2 wheels) Gait Pattern/deviations: Step-to pattern, Decreased step length - right, Decreased step length - left, Shuffle, Trunk flexed Gait velocity: decr     General Gait Details: cues for sequence, posture and position from RW; distance ltd by pain   Stairs             Wheelchair Mobility    Modified Rankin (Stroke Patients Only)       Balance Overall balance assessment: Mild deficits observed, not formally tested                                          Cognition Arousal/Alertness: Awake/alert Behavior During Therapy: WFL for tasks assessed/performed Overall Cognitive Status: Within Functional Limits for tasks assessed                                          Exercises Total Joint Exercises Ankle Circles/Pumps: AROM, Both, 15 reps, Supine Quad Sets: AROM, Both, 10 reps, Supine Heel Slides: AAROM, Left, 20 reps, Supine Hip ABduction/ADduction: Left, AAROM, 15 reps, Supine    General Comments        Pertinent Vitals/Pain Pain Assessment Pain Assessment: 0-10 Pain Score: 6  Pain Location: L hip Pain Descriptors / Indicators: Aching, Sore Pain Intervention(s): Limited activity within patient's tolerance, Monitored during session, Premedicated before session, Ice applied    Home Living  Prior Function            PT Goals (current goals can now be found in the care plan section) Acute Rehab PT Goals Patient Stated Goal: Regain IND PT Goal Formulation: With patient Time For Goal Achievement: 12/14/22 Potential to Achieve Goals: Good Progress towards PT goals: Progressing toward goals    Frequency    7X/week      PT Plan Current plan remains appropriate    Co-evaluation              AM-PAC PT "6 Clicks" Mobility   Outcome Measure  Help needed turning from your back to your side while in a flat bed without using bedrails?: A  Little Help needed moving from lying on your back to sitting on the side of a flat bed without using bedrails?: A Little Help needed moving to and from a bed to a chair (including a wheelchair)?: A Little Help needed standing up from a chair using your arms (e.g., wheelchair or bedside chair)?: A Little Help needed to walk in hospital room?: A Little Help needed climbing 3-5 steps with a railing? : A Lot 6 Click Score: 17    End of Session Equipment Utilized During Treatment: Gait belt Activity Tolerance: Patient tolerated treatment well Patient left: in chair;with call bell/phone within reach;with chair alarm set Nurse Communication: Mobility status PT Visit Diagnosis: Difficulty in walking, not elsewhere classified (R26.2)     Time: 0910-0950 PT Time Calculation (min) (ACUTE ONLY): 40 min  Charges:  $Gait Training: 8-22 mins $Therapeutic Exercise: 8-22 mins                     Debe Coder PT Acute Rehabilitation Services Pager [phone] Office [phone]    Hanne Kegg 12/08/2022, 12:56 PM

## 2022-12-09 NOTE — Anesthesia Postprocedure Evaluation (Signed)
Anesthesia Post Note  Patient: Ason Heslin  Procedure(s) Performed: LEFT TOTAL HIP ARTHROPLASTY ANTERIOR APPROACH (Left: Hip)     Patient location during evaluation: PACU Anesthesia Type: MAC and Spinal Level of consciousness: oriented and awake and alert Pain management: pain level controlled Vital Signs Assessment: post-procedure vital signs reviewed and stable Respiratory status: spontaneous breathing, respiratory function stable and patient connected to nasal cannula oxygen Cardiovascular status: blood pressure returned to baseline and stable Postop Assessment: no headache, no backache and no apparent nausea or vomiting Anesthetic complications: no   No notable events documented.  Last Vitals:  Vitals:   12/08/22 0800 12/08/22 1208  BP: 128/67 136/82  Pulse: 81 96  Resp: 17 16  Temp: 37.2 C 37.2 C  SpO2: 100% 95%    Last Pain:  Vitals:   12/08/22 1208  TempSrc: Oral  PainSc: [place]

## 2022-12-11 ENCOUNTER — Other Ambulatory Visit: Payer: Self-pay | Admitting: Orthopaedic Surgery

## 2022-12-11 MED ORDER — CHLORPROMAZINE HCL 25 MG PO TABS: 25.0000 mg | ORAL_TABLET | Freq: Three times a day (TID) | ORAL | 1 refills | Status: DC | PRN

## 2022-12-12 ENCOUNTER — Encounter (HOSPITAL_COMMUNITY): Payer: Self-pay | Admitting: Orthopaedic Surgery

## 2022-12-20 ENCOUNTER — Encounter: Payer: Self-pay | Admitting: Physician Assistant

## 2022-12-20 ENCOUNTER — Ambulatory Visit (INDEPENDENT_AMBULATORY_CARE_PROVIDER_SITE_OTHER): Payer: BC Managed Care – PPO | Admitting: Physician Assistant

## 2022-12-20 DIAGNOSIS — Z96642 Presence of left artificial hip joint: Secondary | ICD-10-CM

## 2022-12-20 MED ORDER — OXYCODONE HCL 5 MG PO TABS: 5.0000 mg | ORAL_TABLET | ORAL | 0 refills | Status: DC | PRN

## 2022-12-20 NOTE — Progress Notes (Signed)
HPI: Mr. Rock Nephew comes in today status post left total hip arthroplasty.  He is occasionally taking some oxycodone.  Pain is more from the surgical incision is moderate.  He states he is no longer having the hip pain he was having his back pain is improved.  He is on aspirin twice daily for DVT prophylaxis.  Review of systems: Negative for fevers chills.  Physical exam: General well-developed well-nourished male no acute distress ambulates with a cane. Left hip surgical incisions healed well.  No signs of dehiscence or infection.  Significant ecchymosis about the hip.  There is edema plus minus seroma versus hematoma.  Aspiration is attempted an incident dry tap.  Therefore most likely a hematoma. Left calf supple nontender.  Dorsiflexion plantarflexion left ankle intact.  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  Steri-Strips were applied after removing staples today.  He is able to get the incision wet in the shower.  He will take an 81 mg aspirin once daily for another week and then discontinue as he was on no aspirin prior to surgery.  Questions were encouraged and answered at length.

## 2023-01-21 ENCOUNTER — Encounter: Payer: Self-pay | Admitting: Physician Assistant

## 2023-01-21 ENCOUNTER — Ambulatory Visit (INDEPENDENT_AMBULATORY_CARE_PROVIDER_SITE_OTHER): Payer: BC Managed Care – PPO | Admitting: Physician Assistant

## 2023-01-21 DIAGNOSIS — Z96642 Presence of left artificial hip joint: Secondary | ICD-10-CM

## 2023-01-21 NOTE — Addendum Note (Signed)
Addended by: Robyne Peers on: 01/21/2023 03:08 PM   Modules accepted: Orders

## 2023-01-21 NOTE — Progress Notes (Signed)
HPI: Malik Ballard comes in today status post left total hip arthroplasty 12/07/2022.  He states hip is doing well.  He has no pain in the hip.  States that the hematoma slowly went away.  States his incisions well-healed.  His only complaint is he is still trying to lose weight and having trouble.  He is wanting to be sent to the weight loss clinic.  Physical exam: General well-developed well-nourished male ambulates without any assistive device and a nonantalgic gait. Left hip: Surgical incisions healing well no signs of infection.  Left calf supple nontender.  Dorsiflexion plantarflexion left ankle intact.  Good range of motion left hip without pain.  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  He will continue work on scar tissue mobilization.  Will see him back in 6 months postop and obtain an AP pelvis and lateral view of the left hip at that time.  Sooner if there is any questions or concerns

## 2023-01-25 ENCOUNTER — Telehealth: Payer: Self-pay | Admitting: Physician Assistant

## 2023-01-25 NOTE — Telephone Encounter (Signed)
Patient [facility] office called in stating they need patient last office visit notes

## 2023-01-25 NOTE — Telephone Encounter (Signed)
Faxed [facility] [place] note to [facility] @ [phone]

## 2023-03-07 ENCOUNTER — Encounter: Payer: Self-pay | Admitting: Radiology

## 2023-06-10 ENCOUNTER — Encounter: Payer: Self-pay | Admitting: Orthopaedic Surgery

## 2023-06-10 ENCOUNTER — Ambulatory Visit: Payer: PPO | Admitting: Orthopaedic Surgery

## 2023-06-10 ENCOUNTER — Other Ambulatory Visit (INDEPENDENT_AMBULATORY_CARE_PROVIDER_SITE_OTHER): Payer: PPO

## 2023-06-10 DIAGNOSIS — Z96642 Presence of left artificial hip joint: Secondary | ICD-10-CM

## 2023-06-10 NOTE — Progress Notes (Signed)
The patient comes in today at 32-month status post a left total hip arthroplasty.  He said hip is doing great.  His only complaint is some numbness around the incision but that has been getting less over 6 months.  He still has right-sided mid back pain and rib pain that hurts more with activities.  He would like to start using a stairstepper again and I am fine with that.  He says his ADLs and everything are doing well as it relates to his left hip replacement.  He is not taking any medications as a relates to pain.  His left operative hip moves smoothly and fluidly.  An AP pelvis and lateral left hip shows that the implant is in good position and is bone ingrown.  Of note he does have some numbness around his incision but that is getting less.  I would like to send him to outpatient physical therapy just for his mid back pain that is radiating to the right ribs.  Any modalities per the therapist discretion for helping this area try to feel better.  Will see him back in 6 weeks for follow-up after having outpatient therapy.

## 2023-06-11 ENCOUNTER — Telehealth: Payer: Self-pay

## 2023-06-11 NOTE — Telephone Encounter (Signed)
Noted. Thanks.

## 2023-06-11 NOTE — Telephone Encounter (Signed)
Do we have any sort of forms on him at all? From lawyer or anything?

## 2023-06-11 NOTE — Telephone Encounter (Signed)
Yes. I have an opinion letter to fax back that Dr. Magnus Ivan completed. I will fax now. Thanks

## 2023-07-22 ENCOUNTER — Ambulatory Visit: Payer: PPO | Admitting: Orthopaedic Surgery

## 2023-07-22 ENCOUNTER — Encounter: Payer: Self-pay | Admitting: Orthopaedic Surgery

## 2023-07-22 DIAGNOSIS — M546 Pain in thoracic spine: Secondary | ICD-10-CM

## 2023-07-22 DIAGNOSIS — G8929 Other chronic pain: Secondary | ICD-10-CM | POA: Diagnosis not present

## 2023-07-22 DIAGNOSIS — Z96642 Presence of left artificial hip joint: Secondary | ICD-10-CM

## 2023-07-22 NOTE — Progress Notes (Signed)
Momen comes in today about 7 months out from his left hip replacement.  He says the hip is doing great.  He is still dealing with some chronic right-sided mid back pain that radiates around the parascapular area.  Has been through physical therapy and he still having those same symptoms.  He said the left hip is doing great.  He is not walking with any assistive device or limp at all.  His left hip moves smoothly and fluidly.  He still having some pain around the right side of his lower thoracic spine but is more under the scapular and just distal to this.  There is no rash in that area.  He can just try home exercise program for now.  If things worsen with his back we could always obtain an MRI of the thoracic spine.  From my standpoint we will see him back in December with a standing low AP pelvis and lateral of his left hip.  If there are issues before then he knows to let us know.

## 2023-12-05 ENCOUNTER — Ambulatory Visit: Payer: PPO | Admitting: Orthopaedic Surgery

## 2023-12-05 ENCOUNTER — Encounter: Payer: Self-pay | Admitting: Orthopaedic Surgery

## 2023-12-05 ENCOUNTER — Other Ambulatory Visit (INDEPENDENT_AMBULATORY_CARE_PROVIDER_SITE_OTHER): Payer: Self-pay

## 2023-12-05 DIAGNOSIS — Z96642 Presence of left artificial hip joint: Secondary | ICD-10-CM | POA: Diagnosis not present

## 2023-12-05 NOTE — Progress Notes (Signed)
Malik Ballard is now a year out from his left total hip arthroplasty.  He says his hip is doing great.  He has no issues with that at all.  He still has some chronic mid to low back issues.  His left operative hip moves smoothly and fluidly with no complications at all.  It smooth with no blocks to rotation.  His leg lengths are equal.  An AP pelvis standing and a lateral of the left hip shows a well-seated bone ingrown implant with no complicating features of the hip replacement.  At this point follow-up can be as needed for his left hip.  If he does have any issues at all he knows to let us know.  All questions and concerns were addressed and answered.

## 2024-07-21 ENCOUNTER — Encounter: Payer: Self-pay | Admitting: Gastroenterology

## 2024-07-21 ENCOUNTER — Ambulatory Visit (AMBULATORY_SURGERY_CENTER)

## 2024-07-21 VITALS — Ht 72.0 in | Wt 267.0 lb

## 2024-07-21 DIAGNOSIS — Z860100 Personal history of colon polyps, unspecified: Secondary | ICD-10-CM

## 2024-07-21 MED ORDER — NA SULFATE-K SULFATE-MG SULF 17.5-3.13-1.6 GM/177ML PO SOLN: 1.0000 | Freq: Once | ORAL | 0 refills | Status: AC

## 2024-07-21 NOTE — Progress Notes (Signed)

## 2024-07-31 ENCOUNTER — Encounter: Payer: Self-pay | Admitting: Gastroenterology

## 2024-07-31 ENCOUNTER — Ambulatory Visit: Admitting: Gastroenterology

## 2024-07-31 VITALS — BP 117/75 | HR 59 | Temp 98.0°F | Resp 15 | Ht 72.0 in | Wt 267.0 lb

## 2024-07-31 DIAGNOSIS — Z860101 Personal history of adenomatous and serrated colon polyps: Secondary | ICD-10-CM | POA: Diagnosis not present

## 2024-07-31 DIAGNOSIS — D123 Benign neoplasm of transverse colon: Secondary | ICD-10-CM | POA: Diagnosis not present

## 2024-07-31 DIAGNOSIS — Z1211 Encounter for screening for malignant neoplasm of colon: Secondary | ICD-10-CM

## 2024-07-31 DIAGNOSIS — K648 Other hemorrhoids: Secondary | ICD-10-CM

## 2024-07-31 DIAGNOSIS — Z860100 Personal history of colon polyps, unspecified: Secondary | ICD-10-CM

## 2024-07-31 MED ORDER — SODIUM CHLORIDE 0.9 % IV SOLN: 500.0000 mL | Freq: Once | INTRAVENOUS | Status: DC

## 2024-07-31 NOTE — Patient Instructions (Signed)

## 2024-07-31 NOTE — Progress Notes (Signed)
 Called to room to assist during endoscopic procedure.  Patient ID and intended procedure confirmed with present staff. Received instructions for my participation in the procedure from the performing physician.

## 2024-07-31 NOTE — Progress Notes (Signed)
 Malik Ballard History and Physical   Primary Care Physician:  Loring Tanda Mae, MD   Reason for Procedure:   History of colon polyps  Plan:    colonoscopy     HPI: Malik Ballard is a 68 y.o. male  here for colonoscopy  surveillance - history of polyps, 3 adenomas removed 10/2018.   Patient denies any bowel symptoms at this time other than scant rectal bleeding recently. History of hemorrhoid banding in the past. No family history of colon cancer known. Otherwise feels well without any cardiopulmonary symptoms.   I have discussed risks / benefits of anesthesia and endoscopic procedure with Malik Ballard and they wish to proceed with the exams as outlined today.    Past Medical History:  Diagnosis Date   Allergy    Arthritis    Cancer (HCC)    skin cancer removed basal cell   History of kidney stones    Pneumonia     Past Surgical History:  Procedure Laterality Date   COLONOSCOPY     POLYPECTOMY     removed bone spur from right foot     SHOULDER ARTHROSCOPY WITH ROTATOR CUFF REPAIR AND SUBACROMIAL DECOMPRESSION Right 02/04/2018   Procedure: RIGHT SHOULDER ARTHROSCOPY WITH MINI-OPEN ROTATOR CUFF REPAIR, BICEPS TENODESIS, SUBACROMIAL DECOMPRESSION, CLAVICULAR SPUR REMOVAL;  Surgeon: Malik Cordella Hamilton, MD;  Location: [facility] OR;  Service: Orthopedics;  Laterality: Right;   TOTAL HIP ARTHROPLASTY Left 12/07/2022   Procedure: LEFT TOTAL HIP ARTHROPLASTY ANTERIOR APPROACH;  Surgeon: Malik Lonni GRADE, MD;  Location: [facility];  Service: Orthopedics;  Laterality: Left;    Prior to Admission medications   Not on File    No current outpatient medications on file.   Current Facility-Administered Medications  Medication Dose Route Frequency Provider Last Rate Last Admin   0.9 %  sodium chloride  infusion  500 mL Intravenous Once Malik Ballard, Elspeth SQUIBB, MD        Allergies as of 07/31/2024   (No Known Allergies)    Family History  Problem Relation Age of Onset    Stomach cancer Mother    Pancreatic cancer Brother    Colon cancer Neg Hx    Esophageal cancer Neg Hx    Prostate cancer Neg Hx    Rectal cancer Neg Hx    Colon polyps Neg Hx     Social History   Socioeconomic History   Marital status: Married    Spouse name: Not on file   Number of children: Not on file   Years of education: Not on file   Highest education level: Not on file  Occupational History   Not on file  Tobacco Use   Smoking status: Never   Smokeless tobacco: Never  Vaping Use   Vaping status: Never Used  Substance and Sexual Activity   Alcohol use: No   Drug use: No   Sexual activity: Not on file  Other Topics Concern   Not on file  Social History Narrative   Not on file   Social Drivers of Health   Financial Resource Strain: Not on file  Food Insecurity: No Food Insecurity (12/07/2022)   Hunger Vital Sign    Worried About Running Out of Food in the Last Year: Never true    Ran Out of Food in the Last Year: Never true  Transportation Needs: No Transportation Needs (12/07/2022)   PRAPARE - Administrator, Civil Service (Medical): No    Lack of Transportation (Non-Medical): No  Physical Activity:  Not on file  Stress: Not on file  Social Connections: Not on file  Intimate Partner Violence: Not At Risk (12/07/2022)   Humiliation, Afraid, Rape, and Kick questionnaire    Fear of Current or Ex-Partner: No    Emotionally Abused: No    Physically Abused: No    Sexually Abused: No    Review of Systems: All other review of systems negative except as mentioned in the HPI.  Physical Exam: Vital signs BP (!) 149/93   Pulse 64   Temp 98 F (36.7 C)   Ht 6' (1.829 m)   Wt 267 lb (121.1 kg)   SpO2 97%   BMI 36.21 kg/m   General:   Alert,  Well-developed, pleasant and cooperative in NAD Lungs:  Clear throughout to auscultation.   Heart:  Regular rate and rhythm Abdomen:  Soft, nontender and nondistended.   Neuro/Psych:  Alert and cooperative.  Normal mood and affect. A and O x 3  Malik Naval, MD [facility]

## 2024-07-31 NOTE — Progress Notes (Signed)
 Pt's states no medical or surgical changes since previsit or office visit.

## 2024-07-31 NOTE — Progress Notes (Signed)
 Report to PACU, RN, vss, BBS= Clear.

## 2024-07-31 NOTE — Op Note (Signed)
 [facility] Patient Name: Malik Ballard Procedure Date: 07/31/2024 9:12 AM MRN: [id number] Endoscopist: Elspeth P. Leigh , MD, [id number] Age: 68 Referring MD:  Date of Birth: [birth year] Gender: Male Account #: [id number] Procedure:                Colonoscopy Indications:              High risk colon cancer surveillance: Personal                            history of colonic polyps - 3 adenomas removed                            10/2018 Medicines:                Monitored Anesthesia Care Procedure:                Pre-Anesthesia Assessment:                           - Prior to the procedure, a History and Physical                            was performed, and patient medications and                            allergies were reviewed. The patient's tolerance of                            previous anesthesia was also reviewed. The risks                            and benefits of the procedure and the sedation                            options and risks were discussed with the patient.                            All questions were answered, and informed consent                            was obtained. Prior Anticoagulants: The patient has                            taken no anticoagulant or antiplatelet agents. ASA                            Grade Assessment: II - A patient with mild systemic                            disease. After reviewing the risks and benefits,                            the patient was deemed in satisfactory condition to  undergo the procedure.                           After obtaining informed consent, the colonoscope                            was passed under direct vision. Throughout the                            procedure, the patient's blood pressure, pulse, and                            oxygen saturations were monitored continuously. The                            CF [id number] #[id number] was introduced through the anus                             and advanced to the the cecum, identified by                            appendiceal orifice and ileocecal valve. The                            colonoscopy was performed without difficulty. The                            patient tolerated the procedure well. The quality                            of the bowel preparation was good. The ileocecal                            valve, appendiceal orifice, and rectum were                            photographed. Scope In: 9:17:30 AM Scope Out: 9:33:38 AM Scope Withdrawal Time: 0 hours 13 minutes 11 seconds  Total Procedure Duration: 0 hours 16 minutes 8 seconds  Findings:                 The perianal and digital rectal examinations were                            normal.                           Scattered small-mouthed diverticula were found in                            the entire colon.                           Two sessile polyps were found in the transverse  colon. The polyps were 2 to 5 mm in size. These                            polyps were removed with a cold snare. Resection                            and retrieval were complete.                           Internal hemorrhoids were found during retroflexion                            with stigmata of prior banding.                           The exam was otherwise without abnormality. Complications:            No immediate complications. Estimated blood loss:                            Minimal. Estimated Blood Loss:     Estimated blood loss was minimal. Impression:               - Diverticulosis in the entire examined colon.                           - Two 2 to 5 mm polyps in the transverse colon,                            removed with a cold snare. Resected and retrieved.                           - Internal hemorrhoids.                           - The examination was otherwise normal. Recommendation:           - Patient has a contact  number available for                            emergencies. The signs and symptoms of potential                            delayed complications were discussed with the                            patient. Return to normal activities tomorrow.                            Written discharge instructions were provided to the                            patient.                           - Resume previous diet.                           -  Continue present medications.                           - Await pathology results. Elspeth P. Nyashia Raney, MD 07/31/2024 9:38:12 AM This report has been signed electronically.

## 2024-08-03 ENCOUNTER — Telehealth: Payer: Self-pay

## 2024-08-03 NOTE — Telephone Encounter (Signed)
  Follow up Call-     07/31/2024    8:41 AM  Call back number  Post procedure Call Back phone  # [phone]  Permission to leave phone message Yes     Patient questions:  Do you have a fever, pain , or abdominal swelling? No. Pain Score  0 *  Have you tolerated food without any problems? Yes.    Have you been able to return to your normal activities? Yes.    Do you have any questions about your discharge instructions: Diet   No. Medications  No. Follow up visit  No.  Do you have questions or concerns about your Care? No.  Actions: * If pain score is 4 or above: No action needed, pain <4.

## 2024-08-05 ENCOUNTER — Ambulatory Visit: Payer: Self-pay | Admitting: Gastroenterology

## 2024-08-05 LAB — SURGICAL PATHOLOGY

## 2024-11-02 ENCOUNTER — Encounter: Payer: Self-pay | Admitting: Radiology

## 2024-12-08 ENCOUNTER — Encounter (HOSPITAL_COMMUNITY): Payer: Self-pay

## 2024-12-08 ENCOUNTER — Emergency Department (HOSPITAL_COMMUNITY)

## 2024-12-08 ENCOUNTER — Other Ambulatory Visit: Payer: Self-pay

## 2024-12-08 ENCOUNTER — Emergency Department (HOSPITAL_COMMUNITY)
Admission: EM | Admit: 2024-12-08 | Discharge: 2024-12-08 | Disposition: A | Discharge status: Home / Self Care | Attending: Emergency Medicine | Admitting: Emergency Medicine

## 2024-12-08 DIAGNOSIS — N200 Calculus of kidney: Secondary | ICD-10-CM

## 2024-12-08 LAB — URINALYSIS, ROUTINE W REFLEX MICROSCOPIC
Bilirubin Urine: NEGATIVE
Glucose, UA: NEGATIVE mg/dL
Hgb urine dipstick: NEGATIVE
Ketones, ur: NEGATIVE mg/dL
Leukocytes,Ua: NEGATIVE
Nitrite: NEGATIVE
Protein, ur: NEGATIVE mg/dL
Specific Gravity, Urine: >1.046 — ABNORMAL HIGH (ref 1.005–1.030)
pH: 5 (ref 5.0–8.0)

## 2024-12-08 LAB — HEPATIC FUNCTION PANEL
ALT: 13 U/L (ref 0–44)
AST: 26 U/L (ref 15–41)
Albumin: 4.8 g/dL (ref 3.5–5.0)
Alkaline Phosphatase: 74 U/L (ref 38–126)
Bilirubin, Direct: 0.5 mg/dL — ABNORMAL HIGH (ref 0.0–0.2)
Indirect Bilirubin: 1.1 mg/dL — ABNORMAL HIGH (ref 0.3–0.9)
Total Bilirubin: 1.6 mg/dL — ABNORMAL HIGH (ref 0.0–1.2)
Total Protein: 7.6 g/dL (ref 6.5–8.1)

## 2024-12-08 LAB — CBC
HCT: 46.2 % (ref 39.0–52.0)
Hemoglobin: 15.1 g/dL (ref 13.0–17.0)
MCH: 28.8 pg (ref 26.0–34.0)
MCHC: 32.7 g/dL (ref 30.0–36.0)
MCV: 88.2 fL (ref 80.0–100.0)
Platelets: 70 K/uL — ABNORMAL LOW (ref 150–400)
RBC: 5.24 MIL/uL (ref 4.22–5.81)
RDW: 14 % (ref 11.5–15.5)
WBC: 8.4 K/uL (ref 4.0–10.5)
nRBC: 0 % (ref 0.0–0.2)

## 2024-12-08 LAB — BASIC METABOLIC PANEL WITH GFR
Anion gap: 12 (ref 5–15)
BUN: 18 mg/dL (ref 8–23)
CO2: 25 mmol/L (ref 22–32)
Calcium: 9.4 mg/dL (ref 8.9–10.3)
Chloride: 105 mmol/L (ref 98–111)
Creatinine, Ser: 1.3 mg/dL — ABNORMAL HIGH (ref 0.61–1.24)
GFR, Estimated: >60 mL/min (ref >60)
Glucose, Bld: 86 mg/dL (ref 70–99)
Potassium: 3.8 mmol/L (ref 3.5–5.1)
Sodium: 141 mmol/L (ref 135–145)

## 2024-12-08 LAB — LIPASE, BLOOD: Lipase: 47 U/L (ref 11–51)

## 2024-12-08 MED ORDER — IOHEXOL 300 MG/ML  SOLN
100.0000 mL | Freq: Once | INTRAMUSCULAR | Status: AC | PRN
  Administered 2024-12-08: 100 mL via INTRAVENOUS

## 2024-12-08 MED ORDER — SODIUM CHLORIDE 0.9 % IV BOLUS
1000.0000 mL | Freq: Once | INTRAVENOUS | Status: AC
  Administered 2024-12-08: 1000 mL via INTRAVENOUS

## 2024-12-08 MED ORDER — ONDANSETRON HCL 4 MG/2ML IJ SOLN
4.0000 mg | Freq: Once | INTRAMUSCULAR | Status: AC
  Administered 2024-12-08: 4 mg via INTRAVENOUS
  Filled 2024-12-08: qty 2

## 2024-12-08 MED ORDER — ONDANSETRON 4 MG PO TBDP: 4.0000 mg | ORAL_TABLET | Freq: Three times a day (TID) | ORAL | 0 refills | Status: AC | PRN

## 2024-12-08 MED ORDER — MORPHINE SULFATE (PF) 4 MG/ML IV SOLN
4.0000 mg | Freq: Once | INTRAVENOUS | Status: AC
  Administered 2024-12-08: 4 mg via INTRAVENOUS
  Filled 2024-12-08: qty 1

## 2024-12-08 MED ORDER — HYDROMORPHONE HCL 1 MG/ML IJ SOLN
1.0000 mg | Freq: Once | INTRAMUSCULAR | Status: AC
  Administered 2024-12-08: 1 mg via INTRAVENOUS
  Filled 2024-12-08: qty 1

## 2024-12-08 MED ORDER — OXYCODONE-ACETAMINOPHEN 5-325 MG PO TABS: 1.0000 | ORAL_TABLET | Freq: Four times a day (QID) | ORAL | 0 refills | Status: AC | PRN

## 2024-12-08 MED ORDER — TAMSULOSIN HCL 0.4 MG PO CAPS: 0.4000 mg | ORAL_CAPSULE | Freq: Every day | ORAL | 0 refills | Status: AC

## 2024-12-08 MED ORDER — KETOROLAC TROMETHAMINE 30 MG/ML IJ SOLN
30.0000 mg | Freq: Once | INTRAMUSCULAR | Status: AC
  Administered 2024-12-08: 30 mg via INTRAVENOUS
  Filled 2024-12-08: qty 1

## 2024-12-08 NOTE — ED Triage Notes (Signed)
 PT ambulatory to triage with complaints of LEFT flank pain that radiates into the groin and the back. Pt states that he has been drinking lots of water  today, with little urine output. Pt also reports diarrhea X3 today.

## 2024-12-08 NOTE — ED Provider Notes (Signed)
 [place] EMERGENCY DEPARTMENT AT [facility] Provider Note   CSN: [id number] Arrival date & time: 12/08/24  1731     Patient presents with: Flank Pain   Malik Ballard is a 68 y.o. male.   Pt is a 68 yo male with pmhx significant for arthritis, skin cancer, and kidney stones.  Pt has been having some back pain for the past 3 weeks.  Today, he had left flank pain radiating into his groin.  He's also not urinated much today.  No f/c.       Prior to Admission medications   Medication Sig Start Date End Date Taking? Authorizing Provider  ondansetron  (ZOFRAN -ODT) 4 MG disintegrating tablet Take 1 tablet (4 mg total) by mouth every 8 (eight) hours as needed. 12/08/24  Yes Dean Clarity, MD  oxyCODONE -acetaminophen  (PERCOCET/ROXICET) 5-325 MG tablet Take 1 tablet by mouth every 6 (six) hours as needed for severe pain (pain score 7-10). 12/08/24  Yes Dean Clarity, MD  tamsulosin  (FLOMAX ) 0.4 MG CAPS capsule Take 1 capsule (0.4 mg total) by mouth daily. 12/08/24  Yes Dean Clarity, MD    Allergies: Patient has no known allergies.    Review of Systems  Genitourinary:  Positive for flank pain.  All other systems reviewed and are negative.   Updated Vital Signs BP (!) 167/85 (BP Location: Left Arm)   Pulse 70   Temp 98.1 F (36.7 C) (Oral)   Resp 18   SpO2 97%   Physical Exam Vitals and nursing note reviewed.  Constitutional:      Appearance: Normal appearance. He is obese.  HENT:     Head: Normocephalic and atraumatic.     Right Ear: External ear normal.     Left Ear: External ear normal.     Nose: Nose normal.     Mouth/Throat:     Mouth: Mucous membranes are dry.  Eyes:     Extraocular Movements: Extraocular movements intact.     Conjunctiva/sclera: Conjunctivae normal.     Pupils: Pupils are equal, round, and reactive to light.  Cardiovascular:     Rate and Rhythm: Normal rate and regular rhythm.     Pulses: Normal pulses.     Heart sounds: Normal  heart sounds.  Pulmonary:     Effort: Pulmonary effort is normal.     Breath sounds: Normal breath sounds.  Abdominal:     General: Abdomen is flat. Bowel sounds are normal.     Palpations: Abdomen is soft.  Musculoskeletal:        General: Normal range of motion.     Cervical back: Normal range of motion and neck supple.  Skin:    General: Skin is warm.     Capillary Refill: Capillary refill takes less than 2 seconds.  Neurological:     General: No focal deficit present.     Mental Status: He is alert and oriented to person, place, and time.  Psychiatric:        Mood and Affect: Mood normal.        Behavior: Behavior normal.     (all labs ordered are listed, but only abnormal results are displayed) Labs Reviewed  URINALYSIS, ROUTINE W REFLEX MICROSCOPIC - Abnormal; Notable for the following components:      Result Value   Specific Gravity, Urine >1.046 (*)    All other components within normal limits  BASIC METABOLIC PANEL WITH GFR - Abnormal; Notable for the following components:   Creatinine, Ser 1.30 (*)  All other components within normal limits  CBC - Abnormal; Notable for the following components:   Platelets 70 (*)    All other components within normal limits  HEPATIC FUNCTION PANEL - Abnormal; Notable for the following components:   Total Bilirubin 1.6 (*)    Bilirubin, Direct 0.5 (*)    Indirect Bilirubin 1.1 (*)    All other components within normal limits  LIPASE, BLOOD    EKG: None  Radiology: CT ABDOMEN PELVIS W CONTRAST Result Date: 12/08/2024 EXAM: CT ABDOMEN AND PELVIS WITH CONTRAST 12/08/2024 07:37:44 PM TECHNIQUE: CT of the abdomen and pelvis was performed with the administration of 100 mL of iohexol  (OMNIPAQUE ) 300 MG/ML solution. Multiplanar reformatted images are provided for review. Automated exposure control, iterative reconstruction, and/or weight-based adjustment of the mA/kV was utilized to reduce the radiation dose to as low as reasonably  achievable. COMPARISON: None available. CLINICAL HISTORY: Abdominal pain, acute, nonlocalized; family hx pancreatic cancer with upper abd pain; pt also has left flank and lower abd pain. FINDINGS: LOWER CHEST: No acute abnormality. LIVER: The liver is unremarkable. GALLBLADDER AND BILE DUCTS: Gallbladder is unremarkable. No biliary ductal dilatation. SPLEEN: No acute abnormality. PANCREAS: No acute abnormality. ADRENAL GLANDS: No acute abnormality. KIDNEYS, URETERS AND BLADDER: Left kidney: 8 mm nonobstructing stone in the lower pole. Mild left hydronephrosis and perinephric stranding. Left mid pole cyst measures 7.3 cm. Left lower pole cyst measures 3 cm. These appear benign. Per consensus, no follow-up is needed for simple Bosniak type 1 and 2 renal cysts, unless the patient has a malignancy history or risk factors. Left ureter: The distal left ureter cannot be followed due to beam hardening artifact from left hip replacement. There is a 2-3 mm calcification in the left side of the pelvis, which is suspicious for an obstructing distal left ureteral stone given the findings within the left kidney. Right kidney and ureter: No stones. No hydronephrosis. No perinephric or periureteral stranding. Urinary bladder is unremarkable. GI AND BOWEL: Stomach demonstrates no acute abnormality. There is no bowel obstruction. Normal appendix. PERITONEUM AND RETROPERITONEUM: No ascites. No free air. VASCULATURE: Aorta is normal in caliber. LYMPH NODES: No lymphadenopathy. REPRODUCTIVE ORGANS: No acute abnormality. BONES AND SOFT TISSUES: Left hip replacement. No acute osseous abnormality. No focal soft tissue abnormality. IMPRESSION: 1. 2 to 3 mm calcification in the left pelvis suspicious for an obstructing distal left ureteral stone. This cannot be confirmed due to beam hardening artifact from left hip replacement. 2. Mild left hydronephrosis and perinephric stranding, likely secondary to distal ureteral obstruction. 3. 8 mm  nonobstructing stone in the lower pole of the left kidney. Electronically signed by: Franky Crease MD 12/08/2024 07:43 PM EST RP Workstation: [id number]     Procedures   Medications Ordered in the ED  sodium chloride  0.9 % bolus 1,000 mL (0 mLs Intravenous Stopped 12/08/24 2030)  ketorolac  (TORADOL ) 30 MG/ML injection 30 mg (30 mg Intravenous Given 12/08/24 1832)  iohexol  (OMNIPAQUE ) 300 MG/ML solution 100 mL (100 mLs Intravenous Contrast Given 12/08/24 1927)  HYDROmorphone  (DILAUDID ) injection 1 mg (1 mg Intravenous Given 12/08/24 2041)  ondansetron  (ZOFRAN ) injection 4 mg (4 mg Intravenous Given 12/08/24 2040)  ondansetron  (ZOFRAN ) injection 4 mg (4 mg Intravenous Given 12/08/24 2233)  morphine  (PF) 4 MG/ML injection 4 mg (4 mg Intravenous Given 12/08/24 2234)  Medical Decision Making Amount and/or Complexity of Data Reviewed Labs: ordered. Radiology: ordered.  Risk Prescription drug management.   This patient presents to the ED for concern of flank pain, this involves an extensive number of treatment options, and is a complaint that carries with it a high risk of complications and morbidity.  The differential diagnosis includes kidney stones, kidney infection, urinary retention   Co morbidities that complicate the patient evaluation  arthritis, skin cancer, and kidney stones   Additional history obtained:  Additional history obtained from epic chart review   Lab Tests:  I Ordered, and personally interpreted labs.  The pertinent results include:  cbc nl, bmp nl other than cr 1.3 (slight increased from 0.9 in 2023); lfts nl; lip nl; ua neg for uti   Imaging Studies ordered:  I ordered imaging studies including ct abd/pelvis  I independently visualized and interpreted imaging which showed  1. 2 to 3 mm calcification in the left pelvis suspicious for an obstructing  distal left ureteral stone. This cannot be confirmed due to beam hardening   artifact from left hip replacement.  2. Mild left hydronephrosis and perinephric stranding, likely secondary to  distal ureteral obstruction.  3. 8 mm nonobstructing stone in the lower pole of the left kidney.   I agree with the radiologist interpretation   Medicines ordered and prescription drug management:  I ordered medication including ivfs/dilaudid /morphine   for sx  Reevaluation of the patient after these medicines showed that the patient improved I have reviewed the patients home medicines and have made adjustments as needed   Test Considered:  ct   Critical Interventions:  Pain control  Problem List / ED Course:  Renal colic with multiple stones:  pain is under control.  Pt is d/c with percocet, zofran , and flomax .  He is to return if worse.  F/u with urology.   Reevaluation:  After the interventions noted above, I reevaluated the patient and found that they have :improved   Social Determinants of Health:  Lives at home   Dispostion:  After consideration of the diagnostic results and the patients response to treatment, I feel that the patent would benefit from discharge with outpatient f/u.       Final diagnoses:  Kidney stone    ED Discharge Orders          Ordered    oxyCODONE -acetaminophen  (PERCOCET/ROXICET) 5-325 MG tablet  Every 6 hours PRN       Note to Pharmacy: ICD Code:  N20.1   12/08/24 2248    ondansetron  (ZOFRAN -ODT) 4 MG disintegrating tablet  Every 8 hours PRN        12/08/24 2248    tamsulosin  (FLOMAX ) 0.4 MG CAPS capsule  Daily        12/08/24 2248               Dean Clarity, MD 12/08/24 2309
